# Patient Record
Sex: Female | Born: 1990 | Hispanic: Yes | Marital: Single | Smoking: Never smoker
Health system: Southern US, Community
[De-identification: ages and names within clinical notes are randomized; demographics above are authoritative.]

## PROBLEM LIST (undated history)

## (undated) DIAGNOSIS — E0590 Thyrotoxicosis, unspecified without thyrotoxic crisis or storm: Secondary | ICD-10-CM

## (undated) HISTORY — DX: Thyrotoxicosis, unspecified without thyrotoxic crisis or storm: E05.90

---

## 2015-07-22 ENCOUNTER — Inpatient Hospital Stay: Payer: Charity | Admitting: Internal Medicine

## 2015-07-22 ENCOUNTER — Inpatient Hospital Stay
Admission: EM | Admit: 2015-07-22 | Discharge: 2015-07-25 | DRG: 644 | Disposition: A | Discharge status: Home / Self Care | Payer: Charity | Attending: Internal Medicine | Admitting: Internal Medicine

## 2015-07-22 ENCOUNTER — Emergency Department: Payer: Charity

## 2015-07-22 DIAGNOSIS — E0580 Other thyrotoxicosis without thyrotoxic crisis or storm: Secondary | ICD-10-CM

## 2015-07-22 DIAGNOSIS — Z6823 Body mass index (BMI) 23.0-23.9, adult: Secondary | ICD-10-CM

## 2015-07-22 DIAGNOSIS — E0500 Thyrotoxicosis with diffuse goiter without thyrotoxic crisis or storm: Principal | ICD-10-CM | POA: Diagnosis present

## 2015-07-22 DIAGNOSIS — Z9119 Patient's noncompliance with other medical treatment and regimen: Secondary | ICD-10-CM

## 2015-07-22 DIAGNOSIS — R000 Tachycardia, unspecified: Secondary | ICD-10-CM | POA: Diagnosis present

## 2015-07-22 DIAGNOSIS — E440 Moderate protein-calorie malnutrition: Secondary | ICD-10-CM | POA: Diagnosis present

## 2015-07-22 DIAGNOSIS — R748 Abnormal levels of other serum enzymes: Secondary | ICD-10-CM | POA: Diagnosis present

## 2015-07-22 DIAGNOSIS — E871 Hypo-osmolality and hyponatremia: Secondary | ICD-10-CM | POA: Diagnosis present

## 2015-07-22 DIAGNOSIS — K1120 Sialoadenitis, unspecified: Secondary | ICD-10-CM | POA: Diagnosis present

## 2015-07-22 DIAGNOSIS — E876 Hypokalemia: Secondary | ICD-10-CM | POA: Diagnosis present

## 2015-07-22 DIAGNOSIS — E0590 Thyrotoxicosis, unspecified without thyrotoxic crisis or storm: Secondary | ICD-10-CM | POA: Diagnosis present

## 2015-07-22 DIAGNOSIS — J069 Acute upper respiratory infection, unspecified: Secondary | ICD-10-CM

## 2015-07-22 LAB — COMPREHENSIVE METABOLIC PANEL
ALT: 50 U/L (ref 0–55)
AST (SGOT): 57 U/L — ABNORMAL HIGH (ref 5–34)
Albumin/Globulin Ratio: 0.8 — ABNORMAL LOW (ref 0.9–2.2)
Albumin: 3.3 g/dL — ABNORMAL LOW (ref 3.5–5.0)
Alkaline Phosphatase: 222 U/L — ABNORMAL HIGH (ref 37–106)
BUN: 7 mg/dL (ref 7.0–19.0)
Bilirubin, Total: 0.8 mg/dL (ref 0.2–1.2)
CO2: 19 mEq/L — ABNORMAL LOW (ref 22–29)
Calcium: 8.6 mg/dL (ref 8.5–10.5)
Chloride: 98 mEq/L — ABNORMAL LOW (ref 100–111)
Creatinine: 0.5 mg/dL — ABNORMAL LOW (ref 0.6–1.0)
Globulin: 4.3 g/dL — ABNORMAL HIGH (ref 2.0–3.6)
Glucose: 103 mg/dL — ABNORMAL HIGH (ref 70–100)
Potassium: 2.9 mEq/L — ABNORMAL LOW (ref 3.5–5.1)
Protein, Total: 7.6 g/dL (ref 6.0–8.3)
Sodium: 131 mEq/L — ABNORMAL LOW (ref 136–145)

## 2015-07-22 LAB — MAN DIFF ONLY
Atypical Lymphocytes %: 5 %
Atypical Lymphocytes Absolute: 0.4 10*3/uL — ABNORMAL HIGH
Band Neutrophils Absolute: 0 10*3/uL (ref 0.00–1.00)
Band Neutrophils: 0 %
Lymphocytes Absolute Manual: 3.12 10*3/uL (ref 0.50–4.40)
Lymphocytes Manual: 35 %
Monocytes Absolute: 1.36 10*3/uL — ABNORMAL HIGH (ref 0.00–1.20)
Monocytes Manual: 15 %
Neutrophils Absolute Manual: 3.76 10*3/uL (ref 1.80–8.10)
Nucleated RBC: 0 /100 WBC (ref 0–1)
Plasma Cells Absolute: 0.24 10*3/uL — ABNORMAL HIGH
Plasmacytoid: 3 %
Segmented Neutrophils: 42 %

## 2015-07-22 LAB — CELL MORPHOLOGY
Cell Morphology: ABNORMAL — AB
Platelet Estimate: NORMAL

## 2015-07-22 LAB — URINALYSIS, REFLEX TO MICROSCOPIC EXAM IF INDICATED
Bilirubin, UA: NEGATIVE
Glucose, UA: NEGATIVE
Ketones UA: NEGATIVE
Leukocyte Esterase, UA: NEGATIVE
Nitrite, UA: NEGATIVE
Protein, UR: NEGATIVE
Specific Gravity UA: 1.009 (ref 1.001–1.035)
Urine pH: 7 (ref 5.0–8.0)
Urobilinogen, UA: NORMAL mg/dL

## 2015-07-22 LAB — MAGNESIUM: Magnesium: 2.2 mg/dL (ref 1.6–2.6)

## 2015-07-22 LAB — GFR: EGFR: >60

## 2015-07-22 LAB — CBC AND DIFFERENTIAL
Hematocrit: 37.1 % (ref 37.0–47.0)
Hgb: 12.8 g/dL (ref 12.0–16.0)
MCH: 26.6 pg — ABNORMAL LOW (ref 28.0–32.0)
MCHC: 34.5 g/dL (ref 32.0–36.0)
MCV: 77.1 fL — ABNORMAL LOW (ref 80.0–100.0)
MPV: 9.6 fL (ref 9.4–12.3)
Platelets: 332 10*3/uL (ref 140–400)
RBC: 4.81 10*6/uL (ref 4.20–5.40)
RDW: 14 % (ref 12–15)
WBC: 8.88 10*3/uL (ref 3.50–10.80)

## 2015-07-22 LAB — POCT PREGNANCY TEST, URINE HCG: POCT Pregnancy HCG Test, UR: NEGATIVE

## 2015-07-22 LAB — T4, FREE: T4 Free: >6 ng/dL — ABNORMAL HIGH (ref 0.70–1.48)

## 2015-07-22 LAB — TSH: TSH: <0.01 u[IU]/mL — ABNORMAL LOW (ref 0.35–4.94)

## 2015-07-22 LAB — MONONUCLEOSIS SCREEN: Mono Screen: NEGATIVE

## 2015-07-22 MED ORDER — BENZONATATE 100 MG PO CAPS
200.0000 mg | ORAL_CAPSULE | Freq: Three times a day (TID) | ORAL | Status: DC | PRN
Start: 2015-07-22 — End: 2015-07-25
  Administered 2015-07-23 – 2015-07-25 (×5): 200 mg via ORAL
  Filled 2015-07-22 (×8): qty 2

## 2015-07-22 MED ORDER — HYDROMORPHONE HCL 1 MG/ML IJ SOLN
1.0000 mg | INTRAMUSCULAR | Status: DC | PRN
Start: 2015-07-22 — End: 2015-07-25

## 2015-07-22 MED ORDER — POTASSIUM CHLORIDE CRYS ER 20 MEQ PO TBCR
40.0000 meq | EXTENDED_RELEASE_TABLET | Freq: Once | ORAL | Status: AC
Start: 2015-07-22 — End: 2015-07-22
  Administered 2015-07-22: 40 meq via ORAL
  Filled 2015-07-22: qty 2

## 2015-07-22 MED ORDER — METHIMAZOLE 5 MG PO TABS
10.0000 mg | ORAL_TABLET | Freq: Three times a day (TID) | ORAL | Status: DC
Start: 2015-07-22 — End: 2015-07-23
  Administered 2015-07-22 – 2015-07-23 (×2): 10 mg via ORAL
  Filled 2015-07-22 (×5): qty 2

## 2015-07-22 MED ORDER — POTASSIUM CHLORIDE IN NACL 20-0.9 MEQ/L-% IV SOLN
INTRAVENOUS | Status: DC
Start: 2015-07-22 — End: 2015-07-24
  Administered 2015-07-22 – 2015-07-23 (×3): 100 mL/h via INTRAVENOUS

## 2015-07-22 MED ORDER — SODIUM CHLORIDE 0.9 % IV BOLUS
1000.0000 mL | Freq: Once | INTRAVENOUS | Status: AC
Start: 2015-07-22 — End: 2015-07-22
  Administered 2015-07-22: 1000 mL via INTRAVENOUS

## 2015-07-22 MED ORDER — PROPRANOLOL HCL 10 MG PO TABS
10.0000 mg | ORAL_TABLET | Freq: Three times a day (TID) | ORAL | Status: DC
Start: 2015-07-22 — End: 2015-07-25
  Administered 2015-07-22 – 2015-07-25 (×8): 10 mg via ORAL
  Filled 2015-07-22 (×10): qty 1

## 2015-07-22 MED ORDER — OXYCODONE HCL 5 MG PO TABS
10.0000 mg | ORAL_TABLET | ORAL | Status: DC | PRN
Start: 2015-07-22 — End: 2015-07-25
  Administered 2015-07-22 – 2015-07-24 (×3): 10 mg via ORAL
  Filled 2015-07-22 (×4): qty 2

## 2015-07-22 MED ORDER — GUAIFENESIN-DM 100-10 MG/5ML PO SYRP
10.0000 mL | ORAL_SOLUTION | ORAL | Status: DC | PRN
Start: 2015-07-22 — End: 2015-07-25
  Administered 2015-07-23 – 2015-07-24 (×3): 10 mL via ORAL
  Filled 2015-07-22 (×8): qty 10

## 2015-07-22 MED ORDER — ONDANSETRON HCL 4 MG/2ML IJ SOLN
4.0000 mg | Freq: Four times a day (QID) | INTRAMUSCULAR | Status: DC | PRN
Start: 2015-07-22 — End: 2015-07-25
  Administered 2015-07-23 – 2015-07-24 (×3): 4 mg via INTRAVENOUS
  Filled 2015-07-22 (×4): qty 2

## 2015-07-22 MED ORDER — METHIMAZOLE 5 MG PO TABS
10.0000 mg | ORAL_TABLET | Freq: Three times a day (TID) | ORAL | Status: DC
Start: 2015-07-22 — End: 2015-07-22

## 2015-07-22 MED ORDER — PROPRANOLOL HCL 10 MG PO TABS
10.0000 mg | ORAL_TABLET | Freq: Three times a day (TID) | ORAL | Status: DC
Start: 2015-07-22 — End: 2015-07-22

## 2015-07-22 MED ORDER — ACETAMINOPHEN 325 MG PO TABS
650.0000 mg | ORAL_TABLET | ORAL | Status: DC | PRN
Start: 2015-07-22 — End: 2015-07-25

## 2015-07-22 NOTE — H&P (Addendum)
ADMISSION HISTORY AND PHYSICAL EXAM    Date Time: 07/22/2015 6:20 PM  Patient Name: [name] Carrillo,[name] Carrillo  Attending Physician: Sherwood Gambler, MD  Primary Care Physician: Corliss Parish, MD    CC: facial swelling, cough, fever    Assessment:   Principal Problem:    Thyrotoxicosis  Active Problems:    Parotitis    Hyperthyroidism    Hypokalemia    Hyponatremia    Elevated alkaline phosphatase level      25 yo f w/ h/o hyperthyroidism p/w thyrotoxicosis, parotitis, hypokalemia, hyponatremia, elevated alkphos.   Plan:   Admit to med tele - inpt.  Endocrine consult in am.  Tapazol, propranolol.  Telemetry monitoring.  Replete, monitor K, Na.  Check antithyroglobulin AB, antiperoxidate AB, thyroid stimulating immunoglobulin.  RUQ [place], follow alkphos.  SCD's for DVT prophylaxis.    Disposition: (Please see PAF column for Expected D/C Date)   Today's date: 07/22/2015  Admit Date: 07/22/2015  1:58 PM  Anticipated medical stability for discharge:Red - not tomorrow - estimated month/date: 2/27  Service status: Inpatient: risk of morbidity and mortality  Clinical Milestones: TBD  Anticipated discharge needs:f/u by PMD, endocrine    History of Presenting Illness:   [name] Carrillo is a 25 y.o. female w/ h/o hyperthyroidism who p/w bilateral facial swelling x 2 days, F/C/SOB x 1 wk.  Pt was diagnosed w/ hyperthyroidism 2 yrs ago.  But Pt did not take any medication then.  Pt has not followed up due to lack of insurance.  Pt reports baseline fatigue and night sweats .  Pt's labs showed TSH < 0.01, T4 free>6.00, Na 131, K 2.9.  Pt denies N/V/D/CP/abd pain/urinary sx's/focal weakness or any other sx's.    Past Medical History:     Past Medical History   Diagnosis Date   . Hyperthyroidism        Available old records reviewed, including:  EPIC   Past Surgical History:   History reviewed. No pertinent past surgical history.    Family History:     Family History   Problem Relation Age of Onset   . Cancer Father    . Cancer  Sister          Social History:     History   Smoking status   . Never Smoker    Smokeless tobacco   . Not on file     History   Alcohol Use No     History   Drug Use Not on file       Allergies:   No Known Allergies    Medications:     Current/Home Medications    No medications on file         Method by which medications were confirmed on admission:     Review of Systems:   All other systems were reviewed and are negative.    Physical Exam:     Patient Vitals for the past 24 hrs:   BP Temp Temp src Pulse Resp SpO2 Height Weight   07/23/15 0035 111/53 mmHg 97.3 F (36.3 C) Oral (!) 109 19 91 % - -   07/22/15 2021 119/61 mmHg 98.4 F (36.9 C) Oral 92 18 95 % - -   07/22/15 1915 128/60 mmHg - - (!) 122 14 98 % - -   07/22/15 1914 128/60 mmHg - - - - - - -   07/22/15 1913 - - - (!) 122 - 98 % - -   07/22/15  1730 117/58 mmHg - - (!) 128 - 95 % - -   07/22/15 1636 138/63 mmHg - - (!) 122 - 98 % - -   07/22/15 1600 138/63 mmHg - - (!) 122 - 98 % - -   07/22/15 1500 142/66 mmHg - - (!) 132 14 98 % - -   07/22/15 1407 - - - (!) 128 - 98 % - -   07/22/15 1406 125/84 mmHg - - - - - - -   07/22/15 1403 - 99.9 F (37.7 C) Oral - - - - -   07/22/15 1349 147/83 mmHg - - (!) 135 18 98 % 1.549 m (5\' 1" ) 55.792 kg (123 lb)   07/22/15 1343 - 98.2 F (36.8 C) Temporal Art (!) 155 - 99 % - -     Body mass index is 23.25 kg/(m^2).  No intake or output data in the 24 hours ending 07/22/15 1820    General: awake, alert, oriented x 3; no acute distress.  HEENT: bilateral parotitis, perrla, eomi, sclera anicteric  oropharynx clear without lesions, mucous membranes moist  Neck: supple, no lymphadenopathy, no thyromegaly, no JVD, no carotid bruits  Cardiovascular: tachycardic, regular rhythm, no murmurs, rubs or gallops  Lungs: clear to auscultation bilaterally, without wheezing, rhonchi, or rales  Abdomen: soft, non-tender, non-distended; no palpable masses, no hepatosplenomegaly, normoactive bowel sounds, no rebound or  guarding  Extremities: no clubbing, cyanosis, or edema  Neuro: cranial nerves grossly intact, no focal findings   Skin: no rashes or lesions noted      I have personally reviewed the following labs and diagnostic studies.  Labs:     Results     Procedure Component Value Units Date/Time    T4 (Free) [[id number]]  (Abnormal) Collected:  07/22/15 1428    Specimen Information:  Blood Updated:  07/22/15 1742     T4 Free >6.00 (H) ng/dL     Mononucleosis Screen [[id number]] Collected:  07/22/15 1428    Specimen Information:  Blood Updated:  07/22/15 1734     Mono Screen Negative     CBC with differential [[id number]]  (Abnormal) Collected:  07/22/15 1428    Specimen Information:  Blood from Blood Updated:  07/22/15 1632     WBC 8.88 x10 3/uL      Hgb 12.8 g/dL      Hematocrit 83.1 %      Platelets 332 x10 3/uL      RBC 4.81 x10 6/uL      MCV 77.1 (L) fL      MCH 26.6 (L) pg      MCHC 34.5 g/dL      RDW 14 %      MPV 9.6 fL     Manual Differential [[id number]]  (Abnormal) Collected:  07/22/15 1428     Segmented Neutrophils 42 % Updated:  07/22/15 1632     Band Neutrophils 0 %      Lymphocytes Manual 35 %      Monocytes Manual 15 %      Atypical Lymph % 5 %      Plasmacytoid 3 %      Nucleated RBC 0 /100 WBC      Abs Seg Manual 3.76 x10 3/uL      Bands Absolute 0.00 x10 3/uL      Absolute Lymph Manual 3.12 x10 3/uL      Monocytes Absolute 1.36 (H) x10 3/uL      Atypical Lymph Absolute 0.40 (H) x10  3/uL      Absolute Plasma Cell 0.24 (H) x10 3/uL     Cell MorpHology [[id number]]  (Abnormal) Collected:  07/22/15 1428     Cell Morphology: Abnormal (A) Updated:  07/22/15 1632     Microcytic =1+ (A)      Polychromasia =1+ (A)      Ovalocytes =1+ (A)      Platelet Estimate Normal     UA, Reflex to Microscopic (pts  3 + yrs) [[id number]]  (Abnormal) Collected:  07/22/15 1438    Specimen Information:  Urine Updated:  07/22/15 1622     Urine Type Clean Catch      Color, UA Yellow      Clarity, UA Hazy      Specific Gravity UA 1.009       Urine pH 7.0      Leukocyte Esterase, UA Negative      Nitrite, UA Negative      Protein, UR Negative      Glucose, UA Negative      Ketones UA Negative      Urobilinogen, UA Normal mg/dL      Bilirubin, UA Negative      Blood, UA Small (A)      RBC, UA 0 - 5 /hpf      WBC, UA 0 - 5 /hpf      Squamous Epithelial Cells, Urine 0 - 5 /hpf     TSH [[id number]]  (Abnormal) Collected:  07/22/15 1428    Specimen Information:  Blood Updated:  07/22/15 1525     Thyroid Stimulating Hormone <0.01 (L) uIU/mL     Comprehensive metabolic panel [[id number]]  (Abnormal) Collected:  07/22/15 1428    Specimen Information:  Blood Updated:  07/22/15 1509     Glucose 103 (H) mg/dL      BUN 7.0 mg/dL      Creatinine 0.5 (L) mg/dL      Sodium 629 (L) mEq/L      Potassium 2.9 (L) mEq/L      Chloride 98 (L) mEq/L      CO2 19 (L) mEq/L      Calcium 8.6 mg/dL      Protein, Total 7.6 g/dL      Albumin 3.3 (L) g/dL      AST (SGOT) 57 (H) U/L      ALT 50 U/L      Alkaline Phosphatase 222 (H) U/L      Bilirubin, Total 0.8 mg/dL      Globulin 4.3 (H) g/dL      Albumin/Globulin Ratio 0.8 (L)     GFR [[id number]] Collected:  07/22/15 1428     EGFR >60.0 Updated:  07/22/15 1509    Rapid Influenza A/B Antigen [[id number]] Collected:  07/22/15 1426    Specimen Information:  Nasopharyngeal from Nasal Aspirate Updated:  07/22/15 1453    Narrative:      ORDER#: [id number]                                    ORDERED BY: Melodie Bouillon  SOURCE: Nasal Aspirate                               COLLECTED:  07/22/15 14:26  ANTIBIOTICS AT COLL.:  RECEIVED :  07/22/15 14:33  Influenza Rapid Antigen A&B                FINAL       07/22/15 14:53  07/22/15   Negative for Influenza A and B             Reference Range: Negative      Urine HCG, POC/ Qualitative [[id number]] Collected:  07/22/15 1437    Specimen Information:  Urine Updated:  07/22/15 1438     POCT QC Pass      POCT Pregnancy HCG Test, UR Negative      Comment:        Result:         Negative Value is Normal in Healthy Males or Healthy non-pregnant Females        EKG: ST @123     Imaging personally reviewed, including:     Chest 2 Views    07/22/2015  There is no evidence of acute disease. Stephannie Peters, MD 07/22/2015 3:11 PM         Safety Checklist  DVT prophylaxis:  CHEST guideline (See page e199S) Mechanical   Foley:  [place] Rn Foley protocol Not present   IVs:  Peripheral IV   PT/OT: Not needed   Daily CBC & or Chem ordered:  SHM/ABIM guidelines (see #5) Yes, due to clinical and lab instability   Reference for approximate charges of common labs: CBC auto diff - $76  BMP - $99  Mg - $79    Signed by: Gardiner Sleeper, MD   YN:WGNFA, Theodosia Blender, MD  '

## 2015-07-22 NOTE — ED Notes (Signed)
See quick triage note. Pt has multiple complaints - Pt reports fever, chills, cough, HA, n/v, dizziness, & shortness of breath. Pt also reports syncopal episode yesterday. Ambulatory to triage w/out difficulty.

## 2015-07-22 NOTE — ED Provider Notes (Signed)
[facility] EMERGENCY DEPARTMENT RESIDENT H&P       CLINICAL INFORMATION        HPI:      Chief Complaint: Facial Swelling; Cough; and Fever  .    [name] Carrillo is a 25 y.o. female G0 presenting with 2 day history of worsening SOB, cough, fevers, facial swelling, 2 week history of night sweats, and chronic progressive fatigue. The patient states waking up 2 days ago and noting painful swelling in her cheeks. She has no difficulty The patient states that she has a previous diagnosis of hyperthryoidism by labs and [place] 2 years previously. The patient states that she has had fatigue since this diagnosis. She has not continued follow up or treatment for it due to insurance issues.     She denies sick contacts, recent travel, but emig[place] years ago. She states her immunizations are UTD with exception of flu.     History obtained from: patient        ROS:      Review of Systems   Constitutional: Positive for fever, chills, diaphoresis and fatigue.   HENT: Positive for congestion and ear discharge.    Respiratory: Positive for cough and shortness of breath.    All other systems reviewed and are negative.        Physical Exam:      Pulse (!) 155  BP 147/83 mmHg  Resp 18  SpO2 99 %  Temp 98.2 F (36.8 C)    Physical Exam   Constitutional: She is oriented to person, place, and time. She appears well-developed and well-nourished. She appears distressed.   HENT:   Nose: Nose normal.   Mouth/Throat: No oropharyngeal exudate.   Bilateral parotitis, Bilateral swollen tonsils, oropharynx clear without lesions   Eyes: Conjunctivae are normal. Right eye exhibits no discharge. Left eye exhibits no discharge.   Neck: Normal range of motion. Thyromegaly present.   Cardiovascular: Regular rhythm, normal heart sounds and intact distal pulses.  Exam reveals no gallop and no friction rub.    No murmur heard.  Tachycardic   Pulmonary/Chest: Effort normal and breath sounds normal. No  respiratory distress. She has no wheezes. She has no rales. She exhibits no tenderness.   Abdominal: Soft. Bowel sounds are normal. She exhibits no distension and no mass. There is no tenderness. There is no rebound and no guarding. No hernia.   Musculoskeletal: Normal range of motion. She exhibits tenderness (pretibial). She exhibits no edema.   Lymphadenopathy:     She has cervical adenopathy.   Neurological: She is alert and oriented to person, place, and time.   Skin: Skin is warm. No erythema.   Nursing note and vitals reviewed.    Results     Procedure Component Value Units Date/Time    TSH [[id number]]  (Abnormal) Collected:  07/22/15 1428    Specimen Information:  Blood Updated:  07/22/15 1525     Thyroid Stimulating Hormone <0.01 (L) uIU/mL     UA, Reflex to Microscopic (pts  3 + yrs) [[id number]]  (Abnormal) Collected:  07/22/15 1438    Specimen Information:  Urine Updated:  07/22/15 1524     Urine Type Clean Catch      Color, UA Yellow      Clarity, UA Hazy      Specific Gravity UA 1.009      Urine pH 7.0      Leukocyte Esterase, UA Negative      Nitrite,  UA Negative      Protein, UR Negative      Glucose, UA Negative      Ketones UA Negative      Urobilinogen, UA Normal mg/dL      Bilirubin, UA Negative      Blood, UA Small (A)      RBC, UA 0 - 5 /hpf      WBC, UA 0 - 5 /hpf      Squamous Epithelial Cells, Urine 0 - 5 /hpf     Comprehensive metabolic panel [[id number]]  (Abnormal) Collected:  07/22/15 1428    Specimen Information:  Blood Updated:  07/22/15 1509     Glucose 103 (H) mg/dL      BUN 7.0 mg/dL      Creatinine 0.5 (L) mg/dL      Sodium 409 (L) mEq/L      Potassium 2.9 (L) mEq/L      Chloride 98 (L) mEq/L      CO2 19 (L) mEq/L      Calcium 8.6 mg/dL      Protein, Total 7.6 g/dL      Albumin 3.3 (L) g/dL      AST (SGOT) 57 (H) U/L      ALT 50 U/L      Alkaline Phosphatase 222 (H) U/L      Bilirubin, Total 0.8 mg/dL      Globulin 4.3 (H) g/dL      Albumin/Globulin Ratio 0.8 (L)     GFR [[id number]]  Collected:  07/22/15 1428     EGFR >60.0 Updated:  07/22/15 1509    Rapid Influenza A/B Antigen [[id number]] Collected:  07/22/15 1426    Specimen Information:  Nasopharyngeal from Nasal Aspirate Updated:  07/22/15 1453    Narrative:      ORDER#: [id number]                                    ORDERED BY: Melodie Bouillon  SOURCE: Nasal Aspirate                               COLLECTED:  07/22/15 14:26  ANTIBIOTICS AT COLL.:                                RECEIVED :  07/22/15 14:33  Influenza Rapid Antigen A&B                FINAL       07/22/15 14:53  07/22/15   Negative for Influenza A and B             Reference Range: Negative      CBC with differential [[id number]]  (Abnormal) Collected:  07/22/15 1428    Specimen Information:  Blood from Blood Updated:  07/22/15 1444     WBC 8.88 x10 3/uL      Hgb 12.8 g/dL      Hematocrit 01.0 %      Platelets 332 x10 3/uL      RBC 4.81 x10 6/uL      MCV 77.1 (L) fL      MCH 26.6 (L) pg      MCHC 34.5 g/dL      RDW 14 %      MPV 9.6 fL     Urine  HCG, POC/ Qualitative [[id number]] Collected:  07/22/15 1437    Specimen Information:  Urine Updated:  07/22/15 1438     POCT QC Pass      POCT Pregnancy HCG Test, UR Negative      Comment:        Result:        Negative Value is Normal in Healthy Males or Healthy non-pregnant Females    T4 (Free) [[id number]] Collected:  07/22/15 1428    Specimen Information:  Blood Updated:  07/22/15 1434    Mononucleosis Screen [[id number]] Collected:  07/22/15 1428    Specimen Information:  Blood Updated:  07/22/15 1434        No results found.            PAST HISTORY        Primary Care Provider: Corliss Parish, MD        PMH/PSH:    .     Past Medical History   Diagnosis Date   . Disorder of thyroid        She has no past surgical history on file.      Social/Family History:      She reports that she has never smoked. She does not have any smokeless tobacco history on file. She reports that she does not drink alcohol. Her drug history is not on  file.    History reviewed. No pertinent family history.      Listed Medications on Arrival:    .     Home Medications     Last Medication Reconciliation Action:  Complete Gwyneth Sprout, RN 07/22/2015  2:03 PM          No Medications         Allergies: She has No Known Allergies.            VISIT INFORMATION        Reassessments/Clinical Course:    3:36 PM Reassessment with Dr. Geraldo Pitter. TSH <0.01. Fluids running. Patient remains tachycardic in 120s. BP remains elevated at 150s systolic. Patient requesting cough suppressant. Awaiting FT4. Remaining labs reviewed.         Conversations with Other Providers:              Medications Given in the ED:    .     ED Medication Orders     None            Procedures:      Procedures      Assessment/Plan:    25 y.o. female presenting with 2 day history of facial swelling, SOB, cough, fevers, 2 week history of night sweats in context of previous diagnosis of uncontrolled hyperthyroidism.    DDX include viral syndrome (influenza, CMV/EBV, mumps) vs. Uncontrolled hyperthyroidism vs. Other infectious etiology such as TB    Plan  1. IVF  2. Labs to include thyroid function testing  3. CXR  4. Tylenol PRN fever/pain  Dispo: pending work up    Patient discussed with Dr. Geraldo Pitter, ED staff.              Garnett Farm, MD  Resident  07/22/15 1506    Garnett Farm, MD  Resident  07/22/15 1519    Garnett Farm, MD  Resident  07/22/15 [phone]

## 2015-07-22 NOTE — ED Provider Notes (Signed)
[facility] EMERGENCY DEPARTMENT H&P                                             ATTENDING SUPERVISORY NOTE           CLINICAL SUMMARY          Diagnosis:    .     Final diagnoses:   Thyrotoxicosis without thyroid storm, unspecified thyrotoxicosis type   Viral URI   Parotitis         MDM Notes:    Pt with viral sx, significant parotid swelling, EBV and influenza negative.  Prior hx of hyperthyroid, today showing thyrotoxicosis.  BP stable.  Pt admitted to medicine for stabilization of thyroid disease, supportive care.       Disposition:      ED Disposition     Admit Admitting Physician: Gardiner Sleeper [[id number]]  Diagnosis: Thyrotoxicosis [242251]  Estimated Length of Stay: > or = to 2 midnights  Tentative Discharge Plan?: Home or Self Care [1]  Patient Class: Inpatient [101]            Discharge Prescriptions     None                 SUPERVISORY STATEMENT:    I have reviewed and agree with the history except as noted below. The pertinent physical exam has been documented.  I have reviewed and agree with the final ED diagnosis.          CLINICAL INFORMATION        HPI:      Chief Complaint: Facial Swelling; Cough; and Fever  .    [name] Carrillo is a 25 y.o. female with a pmhx of hyperthyroidism who presents with moderate constant worsening b/l facial swelling x 2 days. A/w fevers, chills and SOB x 1 week. Patient has night sweats and fatigue at baseline. Patient states she has not followed up for her hyperthyroidism since her dx 2 years ago due to insurance concerns.     History obtained from: Patient      ROS:      Positive and negative ROS elements as per HPI.  All other systems reviewed and negative.      Physical Exam:      Pulse (!) 155  BP 147/83 mmHg  Resp 18  SpO2 99 %  Temp 98.2 F (36.8 C)    General: Well appearing, A&Ox3. Comfortable, NAD. Non toxic appearing.  Head: Atraumatic, normocephalic. Parotid swelling b/l with ttp. No obvious fluctuance   Eyes: No  conjunctival injection. No discharge.  EOMI.  ENT:  Mucous membranes moist. OP clear.   Neck: Supple. Normal range of motion.  Respiratory/Chest:  No respiratory distress. Lungs clear  CV: Tachy, no murmurs appreciated  Neurological: Grossly symmetric strength and sensation in bilateral arms and legs.   CN II-XII grossly intact.  Speech normal, no facial asymmetry.  Skin: Warm and dry. No rash.  Psychiatric: Normal affect.  Normal insight.               PAST HISTORY        Primary Care Provider: Corliss Parish, MD        PMH/PSH:    .     Past Medical History   Diagnosis Date   . Hyperthyroidism  She has no past surgical history on file.      Social/Family History:      She reports that she has never smoked. She does not have any smokeless tobacco history on file. She reports that she does not drink alcohol. Her drug history is not on file.    History reviewed. No pertinent family history.      Listed Medications on Arrival:    .     Previous Medications    No medications on file      Allergies: She has No Known Allergies.            VISIT INFORMATION        Clinical Course in the ED:    Long delay awaiting return of Free T4      Consults in ED/phone:  6:00 PM - Spoke to Dr. Janece Canterbury. Admit inpatient to Dr. Durene Cal.      Counseling: I have spoken with the patient and discussed today's findings, in addition to providing specific details for the plan of care. Questions are answered and there is agreement with the plan.         Medications Given in the ED:    .     ED Medication Orders     Start Ordered     Status Ordering Provider    07/22/15 1513 07/22/15 1512  sodium chloride 0.9 % bolus 1,000 mL   Once     Route: Intravenous  Ordered Dose: 1,000 mL     Last MAR action:  Stopped GORDON, NATHANIEL T            Procedures:      Procedures      Interpretations:        Differential Diagnosis (not completely inclusive): Parotitis, viral disease, thyrotoxicosis     O2 sat-           saturation: 99 %; Oxygen use: room  air; Interpretation: Normal    EKG -             interpreted by me: sinus tachycardia at 125.  No ST abnormalities                     RESULTS        Lab Results:      Results     Procedure Component Value Units Date/Time    Magnesium [[id number]] Collected:  07/22/15 1428    Specimen Information:  Blood Updated:  07/22/15 1931     Magnesium 2.2 mg/dL     Narrative:      May be added to the specimen in the lab    T4 (Free) [[id number]]  (Abnormal) Collected:  07/22/15 1428    Specimen Information:  Blood Updated:  07/22/15 1742     T4 Free >6.00 (H) ng/dL     Mononucleosis Screen [[id number]] Collected:  07/22/15 1428    Specimen Information:  Blood Updated:  07/22/15 1734     Mono Screen Negative     CBC with differential [[id number]]  (Abnormal) Collected:  07/22/15 1428    Specimen Information:  Blood from Blood Updated:  07/22/15 1632     WBC 8.88 x10 3/uL      Hgb 12.8 g/dL      Hematocrit 62.9 %      Platelets 332 x10 3/uL      RBC 4.81 x10 6/uL      MCV 77.1 (L) fL      MCH 26.6 (  L) pg      MCHC 34.5 g/dL      RDW 14 %      MPV 9.6 fL     Manual Differential [[id number]]  (Abnormal) Collected:  07/22/15 1428     Segmented Neutrophils 42 % Updated:  07/22/15 1632     Band Neutrophils 0 %      Lymphocytes Manual 35 %      Monocytes Manual 15 %      Atypical Lymph % 5 %      Plasmacytoid 3 %      Nucleated RBC 0 /100 WBC      Abs Seg Manual 3.76 x10 3/uL      Bands Absolute 0.00 x10 3/uL      Absolute Lymph Manual 3.12 x10 3/uL      Monocytes Absolute 1.36 (H) x10 3/uL      Atypical Lymph Absolute 0.40 (H) x10 3/uL      Absolute Plasma Cell 0.24 (H) x10 3/uL     Cell MorpHology [[id number]]  (Abnormal) Collected:  07/22/15 1428     Cell Morphology: Abnormal (A) Updated:  07/22/15 1632     Microcytic =1+ (A)      Polychromasia =1+ (A)      Ovalocytes =1+ (A)      Platelet Estimate Normal     UA, Reflex to Microscopic (pts  3 + yrs) [[id number]]  (Abnormal) Collected:  07/22/15 1438    Specimen Information:  Urine  Updated:  07/22/15 1622     Urine Type Clean Catch      Color, UA Yellow      Clarity, UA Hazy      Specific Gravity UA 1.009      Urine pH 7.0      Leukocyte Esterase, UA Negative      Nitrite, UA Negative      Protein, UR Negative      Glucose, UA Negative      Ketones UA Negative      Urobilinogen, UA Normal mg/dL      Bilirubin, UA Negative      Blood, UA Small (A)      RBC, UA 0 - 5 /hpf      WBC, UA 0 - 5 /hpf      Squamous Epithelial Cells, Urine 0 - 5 /hpf     TSH [[id number]]  (Abnormal) Collected:  07/22/15 1428    Specimen Information:  Blood Updated:  07/22/15 1525     Thyroid Stimulating Hormone <0.01 (L) uIU/mL     Comprehensive metabolic panel [[id number]]  (Abnormal) Collected:  07/22/15 1428    Specimen Information:  Blood Updated:  07/22/15 1509     Glucose 103 (H) mg/dL      BUN 7.0 mg/dL      Creatinine 0.5 (L) mg/dL      Sodium 253 (L) mEq/L      Potassium 2.9 (L) mEq/L      Chloride 98 (L) mEq/L      CO2 19 (L) mEq/L      Calcium 8.6 mg/dL      Protein, Total 7.6 g/dL      Albumin 3.3 (L) g/dL      AST (SGOT) 57 (H) U/L      ALT 50 U/L      Alkaline Phosphatase 222 (H) U/L      Bilirubin, Total 0.8 mg/dL      Globulin 4.3 (H) g/dL      Albumin/Globulin  Ratio 0.8 (L)     GFR [[id number]] Collected:  07/22/15 1428     EGFR >60.0 Updated:  07/22/15 1509    Rapid Influenza A/B Antigen [[id number]] Collected:  07/22/15 1426    Specimen Information:  Nasopharyngeal from Nasal Aspirate Updated:  07/22/15 1453    Narrative:      ORDER#: [id number]                                    ORDERED BY: Melodie Bouillon  SOURCE: Nasal Aspirate                               COLLECTED:  07/22/15 14:26  ANTIBIOTICS AT COLL.:                                RECEIVED :  07/22/15 14:33  Influenza Rapid Antigen A&B                FINAL       07/22/15 14:53  07/22/15   Negative for Influenza A and B             Reference Range: Negative      Urine HCG, POC/ Qualitative [[id number]] Collected:  07/22/15 1437    Specimen  Information:  Urine Updated:  07/22/15 1438     POCT QC Pass      POCT Pregnancy HCG Test, UR Negative      Comment:        Result:        Negative Value is Normal in Healthy Males or Healthy non-pregnant Females              Radiology Results:      Chest 2 Views   Final Result      There is no evidence of acute disease.      Stephannie Peters, MD    07/22/2015 3:11 PM                   Scribe Attestation:      I was acting as a Neurosurgeon for Sherwood Gambler, MD on VIGIL CLAROS,[name] Carrillo  Treatment Team: Scribe: Bennett Scrape     I am the first provider for this patient and I personally performed the services documented. Treatment Team: Scribe: Bennett Scrape is scribing for me on VIGIL CLAROS,[name] Carrillo. This note and the patient instructions accurately reflect work and decisions made by me.  Sherwood Gambler, MD                                         Sherwood Gambler, MD  07/22/15 [phone]

## 2015-07-23 ENCOUNTER — Encounter: Payer: Self-pay | Admitting: Internal Medicine

## 2015-07-23 ENCOUNTER — Inpatient Hospital Stay: Payer: Charity

## 2015-07-23 DIAGNOSIS — E0501 Thyrotoxicosis with diffuse goiter with thyrotoxic crisis or storm: Secondary | ICD-10-CM

## 2015-07-23 LAB — HEPATIC FUNCTION PANEL
ALT: 49 U/L (ref 0–55)
AST (SGOT): 62 U/L — ABNORMAL HIGH (ref 5–34)
Albumin/Globulin Ratio: 0.8 — ABNORMAL LOW (ref 0.9–2.2)
Albumin: 2.8 g/dL — ABNORMAL LOW (ref 3.5–5.0)
Alkaline Phosphatase: 176 U/L — ABNORMAL HIGH (ref 37–106)
Bilirubin Direct: 0.2 mg/dL (ref 0.0–0.5)
Bilirubin Indirect: 0.3 mg/dL (ref 0.0–1.1)
Bilirubin, Total: 0.5 mg/dL (ref 0.2–1.2)
Globulin: 3.6 g/dL (ref 2.0–3.6)
Protein, Total: 6.4 g/dL (ref 6.0–8.3)

## 2015-07-23 LAB — T3: T3: 3.99 ng/mL — ABNORMAL HIGH (ref 0.48–1.59)

## 2015-07-23 LAB — BASIC METABOLIC PANEL
BUN: 7 mg/dL (ref 7.0–19.0)
CO2: 19 mEq/L — ABNORMAL LOW (ref 22–29)
Calcium: 8.9 mg/dL (ref 8.5–10.5)
Chloride: 109 mEq/L (ref 100–111)
Creatinine: 0.5 mg/dL — ABNORMAL LOW (ref 0.6–1.0)
Glucose: 118 mg/dL — ABNORMAL HIGH (ref 70–100)
Potassium: 3.7 mEq/L (ref 3.5–5.1)
Sodium: 138 mEq/L (ref 136–145)

## 2015-07-23 LAB — T4: T4: 17.4 ug/dL — ABNORMAL HIGH (ref 4.9–11.7)

## 2015-07-23 LAB — T4, FREE: T4 Free: 4.68 ng/dL — ABNORMAL HIGH (ref 0.70–1.48)

## 2015-07-23 LAB — GFR: EGFR: >60

## 2015-07-23 LAB — T3, FREE: T3, Free: 21.43 pg/mL — ABNORMAL HIGH (ref 1.71–3.71)

## 2015-07-23 MED ORDER — METHIMAZOLE 5 MG PO TABS
20.0000 mg | ORAL_TABLET | Freq: Three times a day (TID) | ORAL | Status: DC
Start: 2015-07-23 — End: 2015-07-25
  Administered 2015-07-23 – 2015-07-25 (×6): 20 mg via ORAL
  Filled 2015-07-23 (×9): qty 4

## 2015-07-23 MED ORDER — PREDNISONE 20 MG PO TABS
20.0000 mg | ORAL_TABLET | Freq: Every morning | ORAL | Status: DC
Start: 2015-07-23 — End: 2015-07-25
  Administered 2015-07-23 – 2015-07-25 (×3): 20 mg via ORAL
  Filled 2015-07-23 (×3): qty 1

## 2015-07-23 NOTE — UM Notes (Signed)
Systemic or Infectious Condition GRG - Clinical Indications for Admission to Inpatient Care      07/22/15 1802  Admit to Inpatient - (ADULT INPATIENT ADMIT PANEL) Once  Comments: I certify that inpatient servi...    07/22/15 1802        Date 2/25    Pt presented to the ED      Chief Complaint: Facial Swelling; Cough; and Fever  .   25 y.o. female with a pmhx of hyperthyroidism who presents with moderate constant worsening b/l facial swelling x 2 days. A/w fevers, chills and SOB x 1 week. Patient has night sweats and fatigue at baseline. Patient states she has not followed up for her hyperthyroidism since her dx 2 years ago due to insurance concerns.     VS;  Pulse (!) 155  BP 147/83 mmHg  Resp 18  SpO2 99 %  Temp 98.2 F (36.8 C)    ED Medication Orders     Start Ordered   Status Ordering Provider    07/22/15 1513 07/22/15 1512  sodium chloride 0.9 % bolus 1,000 mL Once    Route: Intravenous Ordered Dose: 1,000 mL     Last MAR action: Stopped Rachell Cipro T              Lab Results:      Labs in the last 24 hours     Results     Procedure Component Value Units Date/Time    Magnesium [[id number]] Collected: 07/22/15 1428    Specimen Information: Blood Updated: 07/22/15 1931     Magnesium 2.2 mg/dL     Narrative:     May be added to the specimen in the lab    T4 (Free) [[id number]] (Abnormal) Collected: 07/22/15 1428    Specimen Information: Blood Updated: 07/22/15 1742     T4 Free >6.00 (H) ng/dL     Mononucleosis Screen [[id number]] Collected: 07/22/15 1428    Specimen Information: Blood Updated: 07/22/15 1734     Mono Screen Negative     CBC with differential [[id number]] (Abnormal) Collected: 07/22/15 1428    Specimen Information: Blood from Blood Updated: 07/22/15 1632     WBC 8.88 x10 3/uL      Hgb 12.8 g/dL      Hematocrit 62.9 %      Platelets 332 x10 3/uL      RBC 4.81 x10 6/uL       MCV 77.1 (L) fL      MCH 26.6 (L) pg      MCHC 34.5 g/dL      RDW 14 %      MPV 9.6 fL     Manual Differential [[id number]] (Abnormal) Collected: 07/22/15 1428     Segmented Neutrophils 42 % Updated: 07/22/15 1632     Band Neutrophils 0 %      Lymphocytes Manual 35 %      Monocytes Manual 15 %      Atypical Lymph % 5 %      Plasmacytoid 3 %      Nucleated RBC 0 /100 WBC      Abs Seg Manual 3.76 x10 3/uL      Bands Absolute 0.00 x10 3/uL      Absolute Lymph Manual 3.12 x10 3/uL      Monocytes Absolute 1.36 (H) x10 3/uL      Atypical Lymph Absolute 0.40 (H) x10 3/uL      Absolute Plasma Cell 0.24 (H) x10 3/uL  Cell MorpHology [[id number]] (Abnormal) Collected: 07/22/15 1428     Cell Morphology: Abnormal (A) Updated: 07/22/15 1632     Microcytic =1+ (A)      Polychromasia =1+ (A)      Ovalocytes =1+ (A)      Platelet Estimate Normal     UA, Reflex to Microscopic (pts 3 + yrs) [[id number]] (Abnormal) Collected: 07/22/15 1438    Specimen Information: Urine Updated: 07/22/15 1622     Urine Type Clean Catch      Color, UA Yellow      Clarity, UA Hazy      Specific Gravity UA 1.009      Urine pH 7.0      Leukocyte Esterase, UA Negative      Nitrite, UA Negative      Protein, UR Negative      Glucose, UA Negative      Ketones UA Negative      Urobilinogen, UA Normal mg/dL      Bilirubin, UA Negative      Blood, UA Small (A)      RBC, UA 0 - 5 /hpf      WBC, UA 0 - 5 /hpf      Squamous Epithelial Cells, Urine 0 - 5 /hpf     TSH [[id number]] (Abnormal) Collected: 07/22/15 1428    Specimen Information: Blood Updated: 07/22/15 1525     Thyroid Stimulating Hormone <0.01 (L) uIU/mL     Comprehensive metabolic panel [[id number]] (Abnormal) Collected: 07/22/15 1428     Specimen Information: Blood Updated: 07/22/15 1509     Glucose 103 (H) mg/dL      BUN 7.0 mg/dL      Creatinine 0.5 (L) mg/dL      Sodium 010 (L) mEq/L      Potassium 2.9 (L) mEq/L      Chloride 98 (L) mEq/L      CO2 19 (L) mEq/L      Calcium 8.6 mg/dL      Protein, Total 7.6 g/dL      Albumin 3.3 (L) g/dL      AST (SGOT) 57 (H) U/L      ALT 50 U/L      Alkaline Phosphatase 222 (H) U/L      Bilirubin, Total 0.8 mg/dL      Globulin 4.3 (H) g/dL      Albumin/Globulin Ratio 0.8 (L)     GFR [[id number]] Collected: 07/22/15 1428     EGFR >60.0 Updated: 07/22/15 1509    Rapid Influenza A/B Antigen [[id number]] Collected: 07/22/15 1426    Specimen Information: Nasopharyngeal from Nasal Aspirate Updated: 07/22/15 1453    Narrative:     ORDER#: [id number] ORDERED BY: Melodie Bouillon  SOURCE: Nasal Aspirate COLLECTED: 07/22/15 14:26  ANTIBIOTICS AT COLL.: RECEIVED : 07/22/15 14:33  Influenza Rapid Antigen A&B FINAL 07/22/15 14:53  07/22/15 Negative for Influenza A and B   Reference Range: Negative      Urine HCG, POC/ Qualitative [[id number]] Collected: 07/22/15 1437    Specimen Information: Urine Updated: 07/22/15 1438     POCT QC Pass      POCT Pregnancy HCG Test, UR Negative      Comment:        Result:      Negative Value is Normal in Healthy Males or Healthy non-pregnant Females                    2/25 History and physical examination  CC: facial swelling,  cough, fever      History of present illness:  25 y.o. female w/ h/o hyperthyroidism who p/w bilateral facial swelling x 2 days, F/C/SOB x 1 wk. Pt was diagnosed w/ hyperthyroidism 2 yrs ago. But Pt did not take any medication then. Pt has not followed up due to lack of insurance. Pt  reports baseline fatigue and night sweats . Pt's labs showed TSH < 0.01, T4 free>6.00, Na 131, K 2.9. Pt denies N/V/D/CP/abd pain/urinary sx's/focal weakness or any other sx's.    25 yo f w/ h/o hyperthyroidism p/w thyrotoxicosis, parotitis, hypokalemia, hyponatremia, elevated alkphos.   Plan:   Admit to med tele - inpt.  Endocrine consult in am.  Tapazol, propranolol.  Telemetry monitoring.  Replete, monitor K, Na.  Check antithyroglobulin AB, antiperoxidate AB, thyroid stimulating immunoglobulin.  RUQ [place], follow alkphos.  SCD's for DVT prophylaxis.              Continued stay review for 2/26:    VS:  BP 111/53, temp 97.3, pulse 109, RR 19, O2 sat 91%    Meds:  Scheduled Meds:  Current Facility-Administered Medications   Medication Dose Route Frequency   . methIMAzole  20 mg Oral Q8H SCH   . predniSONE  20 mg Oral QAM W/BREAKFAST   . propranolol  10 mg Oral Q8H SCH     Continuous Infusions:  . 0.9 % NaCl with KCl 20 mEq 100 mL/hr (07/23/15 0757)           EKG: ST @123     US Abdomen Limited Ruq  07/23/2015 1. The echotexture of the liver is within normal limits.. There is no intrahepatic biliary ductal dilatation or mass. 2. No gallstones or evidence of acute cholecystitis. 3. There is no hydronephrosis within the right kidney. The remainder is as above.    Medicine MD progress note 2/26:  Plan:   Subjective fever, patient has been afebrile, infectious workup in progress. It seems less likely that the current presentation is due to infectious etiology.  Thyrotoxicosis, endocrinology evaluation is requested continue with her current treatment. Further diagnostic evaluation ordered.  Hypokalemia, potassium level repleted.  Gastrointestinal and deep vein thrombosis prophylaxis as needed case discussed with endocrinology team in detail, time spent is 40 minutes greater than 50 percent time spent counseling and coronation of care            Zenia Resides, RN BSN  UR Case Manager  Case Management Department  [facility]  [phone]

## 2015-07-23 NOTE — Plan of Care (Signed)
Problem: Fluid and Electrolyte Imbalance/ Endocrine  Goal: Fluid and electrolyte balance are achieved/maintained  Outcome: Progressing  Patient admitted with potassium of 2.9.  Oral potassium given in emergency department. Patient currently receiving intravenous replenishment of 20K with NS. Patient running ST on telemetry with no ectopy.  No evidence of seizures.  Will continue to monitor labs as needed and as ordered.

## 2015-07-23 NOTE — Plan of Care (Signed)
Problem: Pain  Goal: Patient's pain/discomfort is manageable  Outcome: Progressing  Tessalon 200mg  po given for non-productive cough.     Problem: Fluid and Electrolyte Imbalance/ Endocrine  Goal: Fluid and electrolyte balance are achieved/maintained  Intervention: Monitor/assess lab values and report abnormal values.  Repeat K 3.7  Intervention: Provide adequate hydration.  Continues .9NS + 20 K @ 100cc/hr.       Comments:   Abdominal US completed. Awaiting [place] of neck. Remains on telemetry.

## 2015-07-23 NOTE — Progress Notes (Signed)
MEDICINE PROGRESS NOTE    Date Time: 07/23/2015 1:28 PM  Patient Name: [name] Carrillo,[name] Carrillo  Attending Physician: Lucilla Edin, MD    Assessment:   Principal Problem:    Thyrotoxicosis  Active Problems:    Parotitis    Hyperthyroidism    Hypokalemia    Hyponatremia    Elevated alkaline phosphatase level  Resolved Problems:    * No resolved hospital problems. *          Plan:   Subjective fever, patient has been afebrile, infectious workup in progress.  It seems less likely that the current presentation is due to infectious etiology.  Thyrotoxicosis, endocrinology evaluation is requested continue with her current treatment.  Further diagnostic evaluation ordered.  Hypokalemia, potassium level repleted.  Gastrointestinal and deep vein thrombosis prophylaxis as needed case discussed with endocrinology team in detail, time spent is 40 minutes greater than 50 percent time spent counseling and coronation of care.  Safety Checklist:     DVT prophylaxis:  CHEST guideline (See page [id number]) Chemical   Foley:  [place]ton Rn Foley protocol Not present   IVs:  Peripheral IV   PT/OT: Not needed   Daily CBC & or Chem ordered:  SHM/ABIM guidelines (see #5) Yes, due to clinical and lab instability   Reference for approximate charges of common labs: CBC auto diff - $76  BMP - $99  Mg - $79    Lines:     Patient Lines/Drains/Airways Status    Active PICC Line / CVC Line / PIV Line / Drain / Airway / Intraosseous Line / Epidural Line / ART Line / Line / Wound / Pressure Ulcer / NG/OG Tube     Name:   Placement date:   Placement time:   Site:   Days:    Peripheral IV 07/22/15 Right Antecubital  07/22/15   1428   Antecubital   less than 1                 Disposition: (Please see PAF column for Expected D/C Date)   Today's date: 07/23/2015  Admit Date: 07/22/2015  1:58 PM  LOS: 1  Clinical Milestones: Thyrotoxicosis  Anticipated discharge needs: to be determined      Subjective     CC: Thyrotoxicosis    Interval History/24 hour events: No acute  complaint.        Review of Systems:     No acute complaint     Physical Exam:     VITAL SIGNS PHYSICAL EXAM   Temp:  [97.3 F (36.3 C)-99.9 F (37.7 C)] 98 F (36.7 C)  Heart Rate:  [92-155] 115  Resp Rate:  [14-22] 22  BP: (111-147)/(53-84) 113/58 mmHg  Blood Glucose:    Telemetry: sinus    No intake or output data in the 24 hours ending 07/23/15 1328 Physical Exam  General: awake, alert X 3  Cardiovascular: tachy, no murmurs, rubs or gallops  Lungs: clear to auscultation bilaterally, without wheezing, rhonchi, or rales  Abdomen: soft, non-tender, non-distended; no palpable masses,  normoactive bowel sounds  Extremities: no edema         Meds:     Medications were reviewed:    Labs:     Labs (last 72 hours):      Recent Labs  Lab 07/22/15  1428   WBC 8.88   HGB 12.8   HEMATOCRIT 37.1   PLATELETS 332            Recent Labs  Lab  07/22/15  1428   SODIUM 131*   POTASSIUM 2.9*   CHLORIDE 98*   CO2 19*   BUN 7.0   CREATININE 0.5*   CALCIUM 8.6   ALBUMIN 3.3*   PROTEIN, TOTAL 7.6   BILIRUBIN, TOTAL 0.8   ALKALINE PHOSPHATASE 222*   ALT 50   AST (SGOT) 57*   GLUCOSE 103*                   Microbiology, reviewed and are significant for:  Microbiology Results     Procedure Component Value Units Date/Time    Rapid Influenza A/B Antigen [[id number]] Collected:  07/22/15 1426    Specimen Information:  Nasopharyngeal from Nasal Aspirate Updated:  07/22/15 1453    Narrative:      ORDER#: [id number]                                    ORDERED BY: Melodie Bouillon  SOURCE: Nasal Aspirate                               COLLECTED:  07/22/15 14:26  ANTIBIOTICS AT COLL.:                                RECEIVED :  07/22/15 14:33  Influenza Rapid Antigen A&B                FINAL       07/22/15 14:53  07/22/15   Negative for Influenza A and B             Reference Range: Negative              Imaging, reviewed and are significant for:  Chest 2 Views    07/22/2015  There is no evidence of acute disease. Stephannie Peters, MD 07/22/2015 3:11  PM     US Abdomen Limited Ruq    07/23/2015  1. The echotexture of the liver is within normal limits.. There is no intrahepatic biliary ductal dilatation or mass. 2. No gallstones or evidence of acute cholecystitis. 3. There is no hydronephrosis within the right kidney. The remainder is as above. Stephannie Peters, MD 07/23/2015 8:47 AM         Signed by: Lucilla Edin, MD

## 2015-07-23 NOTE — Consults (Addendum)
ENDOCRINE NEW CONSULT    Date Time: 07/23/2015 3:48 PM  Patient Name: [name] Carrillo,[name] Carrillo  Requesting Physician: Lucilla Edin, MD  Consulting Physician: Clyde Canterbury, MD  Admission Date: 07/22/2015    Primary Care Physician: Corliss Parish, MD    Endocrinology Consultation for: Hyperglycemia    Assessment/Recommendations:     [name] Carrillo is a 25 y.o. woman with h/o hyperthyroidims, medical non-compliance who presented with acute worsening hyperthyroidism following a URI.  Her Burch-Wartofsky-Score is 65, thus increase risk of developing thyroid strom. Abnormal liver function test and hypokalemia are both likely secondary to her hyperthyroidism.      Recommend methimazole 20 mg TID.  Will have to monitor hepatic panel.    TSI, TPO pending   Will check thyroid ultrasound   Recommend prednisone 20 mg daily   Continue propranolol per primary team.    Reviewed RUQ u/s WNL.   Will continue to follow    Lucilla Edin, MD, thank you for this consultation.  We will follow the patient with you during this hospitalization.  Please contact me with any questions or issues.    Total time of encounter was 45 min > 50% time was spent reviewing her chart, updating her orders, and at the bedside counseling & coordination of care regarding the above needs.     Clyde Canterbury, MD  [facility]  [phone] or Tiger Text or pager ID: [phone]   Hours Mon. - Friday 8- 5:00pm  On nights, holidays and weekends please page the endocrine consult pager [phone]) for emergencies.     History of Presenting Illness:   [name] Carrillo is a 25 y.o. female who was admitted on 07/22/2015 for thyrotoxicosis.  [name] Carrillo is [place] and was diagnosed with hyperthyroidism 2 years ago.  She wast treated with 2 medications, though she cannot recall the names.  She did not notice any improvement with these pills and did not follow up.  Over the past few months she c/o of increase heat  intolerance, 15lb weight loss over 1 month, heart palpitations, had tremor, sweating, sob, insomnia, trouble swallowing and hoarse voice.  She also reports increase anxiety, 3-4 soft stools daily, hair loss, and irregular menstrual cycles.      Past Medical History:     Past Medical History   Diagnosis Date   . Hyperthyroidism         Past Surgical History:   History reviewed. No pertinent past surgical history.    Social History:   Patient  reports that she has never smoked. She does not have any smokeless tobacco history on file. She reports that she does not drink alcohol.    Family History:   Patient reports family history includes Cancer in her father and sister. There is no history of Thyroid disease.     Allergies:   No Known Allergies    Medications:     No prescriptions prior to admission       Current Facility-Administered Medications   Medication Dose Route Frequency   . methIMAzole  20 mg Oral Q8H SCH   . predniSONE  20 mg Oral QAM W/BREAKFAST   . propranolol  10 mg Oral [facility]        Available old records reviewed, including:  Epic.    Review of Systems:   All other systems were reviewed and are negative except per HPI.     Physical Exam:   Temp:  [  97.3 F (36.3 C)-98.4 F (36.9 C)] 98 F (36.7 C)  Heart Rate:  [92-128] 104  Resp Rate:  [14-22] 22  BP: (111-138)/(53-63) 112/58 mmHg   Wt Readings from Last 3 Encounters:   07/22/15 55.792 kg (123 lb)     Body mass index is 23.25 kg/(m^2).  No intake or output data in the 24 hours ending 07/23/15 1548    General: awake, alert, oriented x 3; no acute distress.  HEENT: perrla, no exophthalmos, no lid lag, eomi, sclera anicteric  oropharynx clear without lesions, mucous membranes moist  Neck: supple, no lymphadenopathy, diffuse thyromegaly, no palpable thyroid nodules, thyroid bruit, no JVD  Cardiovascular: regular rate and rhythm, no murmurs, rubs or gallops  Lungs: clear to auscultation bilaterally, without wheezing, rhonchi, or rales  Abdomen: soft,  non-tender, non-distended; no palpable masses, no hepatosplenomegaly, normoactive bowel sounds, no rebound or guarding  Extremities: no clubbing, cyanosis, or edema  Neuro: A+O x 3, cranial nerves grossly intact, strength 5/5 in upper and lower extremities, sensation intact, DTR 2+ and brisk in bilateral bicep tendons and 3+in bilateral patellar tendons. Bilateral fine hand tremor.   Skin: no rashes or lesions noted      Labs:   No results found for: GLUCOSEWB    No results found for: HGBA1C    Recent Labs      07/22/15   1428   WBC  8.88   HGB  12.8   HEMATOCRIT  37.1   PLATELETS  332       Recent Labs      07/23/15   1425  07/22/15   1428   SODIUM  138  131*   POTASSIUM  3.7  2.9*   CHLORIDE  109  98*   CO2  19*  19*   BUN  7.0  7.0   CREATININE  0.5*  0.5*   GLUCOSE  118*  103*   CALCIUM  8.9  8.6   MAGNESIUM   --   2.2       Recent Labs      07/23/15   1425  07/22/15   1428   AST (SGOT)  62*  57*   ALT  49  50   ALKALINE PHOSPHATASE  176*  222*   PROTEIN, TOTAL  6.4  7.6   ALBUMIN  2.8*  3.3*       No results for input(s): PTT, PT, INR in the last 72 hours.    THYROID STIMULATING HORMONE   Date Value Ref Range Status   07/22/2015 <0.01* 0.35 - 4.94 uIU/mL Final     T4 FREE   Date Value Ref Range Status   07/23/2015 4.68* 0.70 - 1.48 ng/dL Final   16/02/9603 >5.40* 0.70 - 1.48 ng/dL Final     T4   Date Value Ref Range Status   07/23/2015 17.4* 4.9 - 11.7 ug/dL Final     T3, FREE   Date Value Ref Range Status   07/23/2015 21.43* 1.71 - 3.71 pg/mL Final     T3   Date Value Ref Range Status   07/23/2015 3.99* 0.48 - 1.59 ng/mL Final       Imaging:     Imaging personally reviewed, including: US Abdomen Limited Ruq    07/23/2015  1. The echotexture of the liver is within normal limits.. There is no intrahepatic biliary ductal dilatation or mass. 2. No gallstones or evidence of acute cholecystitis. 3. There is no hydronephrosis within the right kidney. The  remainder is as above. Stephannie Peters, MD 07/23/2015 8:47  AM     Signed by: Clyde Canterbury, MD    cc: Lucilla Edin, MD  Corliss Parish, MD

## 2015-07-23 NOTE — Interim Summary (Signed)
ENDOCRINE INTERIM NOTE    Date Time: 07/23/2015 12:37 PM  Patient Name: [name] Carrillo,[name] Carrillo  Attending Physician: Lucilla Edin, MD  Consulting Physician: Clyde Canterbury, MD  Admit Date:  07/22/2015    Assessment/Plan:     Patient Active Problem List    Diagnosis Date Noted   . Thyrotoxicosis 07/22/2015   . Parotitis 07/22/2015   . Hyperthyroidism 07/22/2015   . Hypokalemia 07/22/2015   . Hyponatremia 07/22/2015   . Elevated alkaline phosphatase level 07/22/2015      Recommend Methimazole 20 mg TID   Will check freeT4, TSIab, and TPOab   Added prednisone 20 mg q AM   Formal consultation to follow.     Clyde Canterbury, MD  [facility]    On nights, holidays and weekends please page the endocrine consult pager [phone]) for emergencies/questions.     Vitals:   Temp:  [97.3 F (36.3 C)-99.9 F (37.7 C)] 98 F (36.7 C)  Heart Rate:  [92-155] 115  Resp Rate:  [14-22] 22  BP: (111-147)/(53-84) 113/58 mmHg  Wt Readings from Last 1 Encounters:   07/22/15 55.792 kg (123 lb)      Body mass index is 23.25 kg/(m^2).   No intake or output data in the 24 hours ending 07/23/15 1237    Meds:     Scheduled Meds:  Current Facility-Administered Medications   Medication Dose Route Frequency   . methIMAzole  20 mg Oral Q8H SCH   . predniSONE  20 mg Oral QAM W/BREAKFAST   . propranolol  10 mg Oral Q8H SCH     Continuous Infusions:  . 0.9 % NaCl with KCl 20 mEq 100 mL/hr (07/23/15 0757)     PRN Meds:.acetaminophen, benzonatate, guaiFENesin-dextromethorphan, HYDROmorphone, ondansetron, oxyCODONE    Labs:     No results found for: HGBA1C    No results found for: GLUCOSEWB        Recent Labs      07/22/15   1428   WBC  8.88   HGB  12.8   HEMATOCRIT  37.1   PLATELETS  332   MCV  77.1*       Recent Labs      07/22/15   1428   SODIUM  131*   POTASSIUM  2.9*   CHLORIDE  98*   CO2  19*   BUN  7.0   CREATININE  0.5*   GLUCOSE  103*   CALCIUM  8.6   MAGNESIUM  2.2       Recent Labs      07/22/15   1428   AST (SGOT)  57*   ALT  50    ALKALINE PHOSPHATASE  222*   PROTEIN, TOTAL  7.6   ALBUMIN  3.3*       No results for input(s): PTT, PT, INR in the last 72 hours.      Signed by: Clyde Canterbury, MD

## 2015-07-23 NOTE — Plan of Care (Signed)
Patient admitted from emergency department. Ambulated from stretcher to bed, steady and stable. Alert and oriented. C/o facial pain 8/10. Non-productive cough. NS with 20K running at 170ml/h to RAC. Educated on SCDs. Placed on tele. Oriented to room. . Call bell within reach. Bed in lowest position.

## 2015-07-24 ENCOUNTER — Inpatient Hospital Stay: Payer: Charity

## 2015-07-24 ENCOUNTER — Encounter (INDEPENDENT_AMBULATORY_CARE_PROVIDER_SITE_OTHER): Payer: Self-pay

## 2015-07-24 LAB — T3: T3: 2.83 ng/mL — ABNORMAL HIGH (ref 0.48–1.59)

## 2015-07-24 LAB — CBC AND DIFFERENTIAL
Basophils Absolute Automated: 0.02 10*3/uL (ref 0.00–0.20)
Basophils Automated: 0 %
Eosinophils Absolute Automated: 0.03 10*3/uL (ref 0.00–0.70)
Eosinophils Automated: 0 %
Hematocrit: 31.8 % — ABNORMAL LOW (ref 37.0–47.0)
Hgb: 10.4 g/dL — ABNORMAL LOW (ref 12.0–16.0)
Immature Granulocytes Absolute: 0.02 10*3/uL
Immature Granulocytes: 0 %
Lymphocytes Absolute Automated: 5.71 10*3/uL — ABNORMAL HIGH (ref 0.50–4.40)
Lymphocytes Automated: 62 %
MCH: 26.1 pg — ABNORMAL LOW (ref 28.0–32.0)
MCHC: 32.7 g/dL (ref 32.0–36.0)
MCV: 79.9 fL — ABNORMAL LOW (ref 80.0–100.0)
MPV: 9.9 fL (ref 9.4–12.3)
Monocytes Absolute Automated: 0.64 10*3/uL (ref 0.00–1.20)
Monocytes: 7 %
Neutrophils Absolute: 2.83 10*3/uL (ref 1.80–8.10)
Neutrophils: 31 %
Nucleated RBC: 0 /100 WBC (ref 0–1)
Platelets: 328 10*3/uL (ref 140–400)
RBC: 3.98 10*6/uL — ABNORMAL LOW (ref 4.20–5.40)
RDW: 15 % (ref 12–15)
WBC: 9.25 10*3/uL (ref 3.50–10.80)

## 2015-07-24 LAB — COMPREHENSIVE METABOLIC PANEL
ALT: 36 U/L (ref 0–55)
AST (SGOT): 29 U/L (ref 5–34)
Albumin/Globulin Ratio: 0.7 — ABNORMAL LOW (ref 0.9–2.2)
Albumin: 2.6 g/dL — ABNORMAL LOW (ref 3.5–5.0)
Alkaline Phosphatase: 153 U/L — ABNORMAL HIGH (ref 37–106)
BUN: 8 mg/dL (ref 7.0–19.0)
Bilirubin, Total: 0.4 mg/dL (ref 0.2–1.2)
CO2: 19 mEq/L — ABNORMAL LOW (ref 22–29)
Calcium: 8.6 mg/dL (ref 8.5–10.5)
Chloride: 112 mEq/L — ABNORMAL HIGH (ref 100–111)
Creatinine: 0.5 mg/dL — ABNORMAL LOW (ref 0.6–1.0)
Globulin: 3.5 g/dL (ref 2.0–3.6)
Glucose: 107 mg/dL — ABNORMAL HIGH (ref 70–100)
Potassium: 3.5 mEq/L (ref 3.5–5.1)
Protein, Total: 6.1 g/dL (ref 6.0–8.3)
Sodium: 140 mEq/L (ref 136–145)

## 2015-07-24 LAB — ECG 12-LEAD
Atrial Rate: 123 {beats}/min
Atrial Rate: 93 {beats}/min
P Axis: 47 degrees
P Axis: 55 degrees
P-R Interval: 132 ms
P-R Interval: 246 ms
Q-T Interval: 348 ms
Q-T Interval: 366 ms
QRS Duration: 90 ms
QRS Duration: 88 ms
QTC Calculation (Bezet): 498 ms
QTC Calculation (Bezet): 455 ms
R Axis: 24 degrees
R Axis: 29 degrees
T Axis: 43 degrees
T Axis: 33 degrees
Ventricular Rate: 123 {beats}/min
Ventricular Rate: 93 {beats}/min

## 2015-07-24 LAB — T4, FREE: T4 Free: 4.16 ng/dL — ABNORMAL HIGH (ref 0.70–1.48)

## 2015-07-24 LAB — GFR: EGFR: >60

## 2015-07-24 NOTE — Plan of Care (Signed)
Problem: Safety  Goal: Patient will be free from injury during hospitalization  Intervention: Ensure appropriate safety devices are available at the bedside  Bed in lowest position, wheels locked, call light in reach

## 2015-07-24 NOTE — Progress Notes (Signed)
Schedule follow up appointment with [facility] on  3/10 @ 10:00am in the [facility]      [place]  [phone]

## 2015-07-24 NOTE — Progress Notes (Signed)
MEDICINE PROGRESS NOTE    Date Time: 07/24/2015 5:30 AM  Patient Name: [name],[name]  Attending Physician: Lucilla Edin, MD    Assessment:   Principal Problem:    Thyrotoxicosis  Active Problems:    Parotitis    Hyperthyroidism    Hypokalemia    Hyponatremia    Elevated alkaline phosphatase level  Resolved Problems:    * No resolved hospital problems. *    25 yo F w/ a PMH significant for hyperthyroidism diagnosed in 2015 and not compliant with treatment admitted on 07/22/2015 with thyrotoxicosis preceded by a prodrome of URI. Also found to have parotitis, hypokalemia, hyponatremia, elevated alk phos.   Plan:   #Thyrotoxicosis: Improving, with no evidence of thyroid storm. In the setting of known hyperthyroidism (Graves Disease). Elevated T4 (>6) and low TSH (<0.01) on admission c/w thyrotoxicosis; also with T3 of 3.99, Free T3 of 21.43, T4 17.4. Sinus tachycardia has improved, free T4 downtrending (4.68->4.16) with treatment. Etiology may be viral thyroiditis given URI symptoms and tender thyroid on exam. Struma ovarii is unlikely given negative urine HCG in ED, and the patient is not on any medications that would precipitate hyperthyroidism.  -F/u TSI/thyroid peroxidase antibodies  -Thyroid u/s ordered, will f/u  -Tapazol, propranolol  -Prednisone 20mg  daily (2/26-)  -Endocrine following, appreciate recs    #Parotitis: possibly viral  -Prednisone as above    #Hyponatremia: resolved, possibly related to thyrotoxicosis. Na 131 on admission, 138 yesterday.  -IVF    #Hypokalemia: resolved, likely related to #1. K 2.9 on admission, 3.7 yesterday.  -IVF    #Elevated alk phos: likely related to #1. Improved (222 on admission, 176 yesterday)  -RUQ u/s wnl    F: PO  E: Replete PRN  N: Regular diet    DVT PPx: SCD's      Case discussed with: Lucilla Edin, MD     Safety Checklist:     DVT prophylaxis:  CHEST guideline (See page [phone]) Mechanical   Foley:  Lovilia Rn Foley protocol Not present   IVs:  Peripheral IV   PT/OT:  Not needed   Daily CBC & or Chem ordered:  SHM/ABIM guidelines (see #5) Yes, due to clinical and lab instability   Reference for approximate charges of common labs: CBC auto diff - $76  BMP - $99  Mg - $79    Lines:     Patient Lines/Drains/Airways Status    Active PICC Line / CVC Line / PIV Line / Drain / Airway / Intraosseous Line / Epidural Line / ART Line / Line / Wound / Pressure Ulcer / NG/OG Tube     Name:   Placement date:   Placement time:   Site:   Days:    Peripheral IV 07/22/15 Right Antecubital  07/22/15   1428   Antecubital   1                   Subjective     CC: Thyrotoxicosis    Interval History/24 hour events:     History obtained via chart review and direct interview of the patient.     [name] Carrillo is a 25 y.o. female w/ PMH significant for hyperthyroidism who presented to the hospital on 07/22/2015 with 2 days of worsening SOB, cough, subjective, facial swelling, preceded by 2 weeks of night sweats, and chronic progressive fatigue. The patient states waking up 2 days ago and noting painful swelling in her cheeks. She has never had these symptoms before.  Tylenol and aleve provided some relief.     She denies pain with chewing. She endorses dry mouth, but not dry eyes.     She practices good oral hygiene. She denies sick contacts, recent travel, but emigrated from [place] 12 years ago. She states her immunizations are UTD with exception of flu. She has never been on amiodarone.    She was initially diagnosed with hyperthyroidism in October 2015 by endocrinologist Dr. Barbra Sarks [facility]), an ultrasound then showed enlarged hypervascular heterogeneous thyroid gland suggestive of Graves' disease and mildly enlarged neck lymph nodes as described above.  Recommendation was made to start propranolol 60mg  PO daily qhs, methimazole 1mg  BID. She has not continued follow up or treatment for it due to insurance issues. She stopped taking her meds because they "did not make her feel  good."    In the ED on the day of admission, she was noted to have TSH <0.01, Free T4 >6; also with mild hyponatremia to 131, hypokalemia to 2.9.    Patient was started on methimazole, propranolol, potassium was repleted with Kdur and NS infusion with KCl was started.     HPI/Subjective: Today, the patient feels better, but still endorses cough, sore throat, rhinorrhea.     Review of Systems:     Over the past few months she c/o of increase heat intolerance, 15lb weight loss over 1 month, heart palpitations, had tremor, sweating, sob, insomnia, trouble swallowing and hoarse voice. She also reports increase anxiety, 3-4 soft stools daily, hair loss, and irregular menstrual cycles.     Physical Exam:     VITAL SIGNS PHYSICAL EXAM   Temp:  [96.1 F (35.6 C)-98 F (36.7 C)] 96.2 F (35.7 C)  Heart Rate:  [95-115] 99  Resp Rate:  [18-22] 20  BP: (108-157)/(58-64) 129/63 mmHg  Blood Glucose:    Telemetry: n/a    No intake or output data in the 24 hours ending 07/24/15 0530 Physical Exam  General: calm, NAD, AOx4  HEENT: no proptosis or lid lag; tender thyroid, subtly enlarged; tonsils are erythematous with no exudate; parotid glands mildly enlarged- with no exudate expressed from parotid duct  Cardiovascular: regular rate and rhythm, no murmurs, rubs or gallops  Lungs: clear to auscultation bilaterally, without wheezing, rhonchi, or rales  Abdomen: soft, non-tender, non-distended; no palpable masses,  normoactive bowel sounds  Extremities: no edema         Meds:     Medications were reviewed:    Current facility-administered medications:   .  0.9 % NaCl with KCl 20 mEq infusion, , Intravenous, Continuous, Hunter, Daleen Squibb, MD, Last Rate: 100 mL/hr at 07/23/15 1836, 100 mL/hr at 07/23/15 1836  .  acetaminophen (TYLENOL) tablet 650 mg, 650 mg, Oral, Q4H PRN, Gardiner Sleeper, MD  .  benzonatate (TESSALON) capsule 200 mg, 200 mg, Oral, TID PRN, Gardiner Sleeper, MD, 200 mg at 07/24/15 0327  .   guaiFENesin-dextromethorphan (ROBITUSSIN DM) 100-10 MG/5ML syrup 10 mL, 10 mL, Oral, Q4H PRN, Gardiner Sleeper, MD, 10 mL at 07/23/15 2247  .  HYDROmorphone (DILAUDID) injection 1 mg, 1 mg, Intravenous, Q4H PRN, Gardiner Sleeper, MD  .  methIMAzole (TAPAZOLE) tablet 20 mg, 20 mg, Oral, Q8H SCH, Clyde Canterbury, MD, 20 mg at 07/24/15 0602  .  ondansetron (ZOFRAN) injection 4 mg, 4 mg, Intravenous, Q6H PRN, Gardiner Sleeper, MD, 4 mg at 07/24/15 0128  .  oxyCODONE (ROXICODONE) immediate release tablet 10 mg,  10 mg, Oral, Q4H PRN, Gardiner Sleeper, MD, 10 mg at 07/24/15 0127  .  predniSONE (DELTASONE) tablet 20 mg, 20 mg, Oral, QAM Hillard Danker, MD, 20 mg at 07/23/15 1345  .  propranolol (INDERAL) tablet 10 mg, 10 mg, Oral, Q8H SCH, Gardiner Sleeper, MD, 10 mg at 07/24/15 0602      Labs:     Labs (last 72 hours):      Recent Labs  Lab 07/22/15  1428   WBC 8.88   HGB 12.8   HEMATOCRIT 37.1   PLATELETS 332            Recent Labs  Lab 07/23/15  1425 07/22/15  1428   SODIUM 138 131*   POTASSIUM 3.7 2.9*   CHLORIDE 109 98*   CO2 19* 19*   BUN 7.0 7.0   CREATININE 0.5* 0.5*   CALCIUM 8.9 8.6   ALBUMIN 2.8* 3.3*   PROTEIN, TOTAL 6.4 7.6   BILIRUBIN, TOTAL 0.5 0.8   ALKALINE PHOSPHATASE 176* 222*   ALT 49 50   AST (SGOT) 62* 57*   GLUCOSE 118* 103*                   Microbiology, reviewed and are significant for:  Microbiology Results     Procedure Component Value Units Date/Time    Rapid Influenza A/B Antigen [[id number]] Collected:  07/22/15 1426    Specimen Information:  Nasopharyngeal from Nasal Aspirate Updated:  07/22/15 1453    Narrative:      ORDER#: [id number]                                    ORDERED BY: Melodie Bouillon  SOURCE: Nasal Aspirate                               COLLECTED:  07/22/15 14:26  ANTIBIOTICS AT COLL.:                                RECEIVED :  07/22/15 14:33  Influenza Rapid Antigen A&B                FINAL       07/22/15 14:53  07/22/15   Negative for Influenza A and B              Reference Range: Negative             Imaging, reviewed and are significant for:  Chest 2 Views    07/22/2015  There is no evidence of acute disease. Stephannie Peters, MD 07/22/2015 3:11 PM     US Abdomen Limited Ruq    07/23/2015  1. The echotexture of the liver is within normal limits.. There is no intrahepatic biliary ductal dilatation or mass. 2. No gallstones or evidence of acute cholecystitis. 3. There is no hydronephrosis within the right kidney. The remainder is as above. Stephannie Peters, MD 07/23/2015 8:47 AM           Signed by: Barbra Sarks, MD

## 2015-07-24 NOTE — Malnutrition Assessment (Signed)
NUTRITION:  Reason for Assessment: RN Screen     Recommend:  Regular diet as tolerated, encourage po intake  If po intake <75% at meals, rec re-offer supplement - Ensure Enlive with meals     Clinical Update:  [place] is a(n) 25 y.o. female with thyrotoxicosis, parotitis, hyperthyroidism, hypokalemia, hyponatremia      Labs: Glu 107, Alk Phos 153, Alb 2.6   Meds: prednisone, idneral, IVF @ 100 ml/hr     Anthropometrics:  Height: 154.9 cm (5\' 1" )  Weight: 55.792 kg (123 lb)  Body mass index is 23.25 kg/(m^2).    Diet / Nutrition Support Order: Regular diet   Per patient, lost about 5# x 1 week, poor po intake x 1 week, no supplements requested at this time     Nutrition Goals: 1550-1700 kcals, 65-75 gm protein     Nutrition Diagnosis:   Moderate protein calorie malnutrition related to thyrotoxicosis, parotitis, electrolyte abnormalities a/e/b lab values, poor po intake w/ wt loss of 4% x 1 week     Monitor/Eval:  Monitor nutr support goals, labs, GI, med tx plan    Georgina Pillion, RD

## 2015-07-24 NOTE — Progress Notes (Signed)
Attending Attestation:     I have seen and personally examined the patient.  I agree with the findings and exam as documented by Dr. Ulla Potash.    Plan:  Thyrotoxicosis, at the current time patient is hemodynamically stable, endocrinology evaluation and recommendation is appreciated thyroid sonogram pending.  Parotitis, improving  Hyponatremia likely related to thyrotoxicosis sodium is improving.  Hypokalemia, potassium supplemented, currently at normal level.        Disposition:     Today's date: 07/24/2015  Admit Date: 07/22/2015  1:58 PM  Anticipated medical stability for discharge:Yellow - maybe tomorrow  Service status: Inpatient: risk of morbidity and mortality, risk of progressive disease and risk of readmission  Clinical Milestones: Thyrotoxicosis     Anticipated discharge needs: TBD determined.    Lucilla Edin, MD

## 2015-07-24 NOTE — Plan of Care (Signed)
Problem: Fluid and Electrolyte Imbalance/ Endocrine  Goal: Fluid and electrolyte balance are achieved/maintained  Outcome: Progressing  Pt's electrolytes are returning to baseline. Pt's potassium and sodium are stable  Goal: Adequate hydration  Outcome: Progressing  Pt has no nausea and is eating and drinking

## 2015-07-24 NOTE — Progress Notes (Addendum)
ENDOCRINE PROGRESS NOTE    Date Time: 07/24/2015 1:01 PM  Patient Name: [name] Carrillo Carrillo,[name] Carrillo  Attending Physician: Lucilla Edin, MD  Consulting Physician: Clyde Canterbury, MD  Admit Date:  07/22/2015    CC: Hyperthyroidism    Assessment/Plan:   Ms. [name] Carrillo Carrillo is a 25 y.o. 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 Her Burch-Wartofsky-Score was 65, thus increase risk of developing thyroid strom. Abnormal liver function test (stable) and hypokalemia (resolved) were both likely secondary to her hyperthyroidism. No thyroid nodules appreciated on neck ultrasound.      Continue methimazole 20 mg TID. Will have to monitor hepatic panel.    TSI, TPO pending   Continue prednisone 20 mg daily   Continue propranolol per primary team.    Reviewed RUQ u/s WNL.   Will continue to follow   Reviewed thyroid ultrasound, see below.     Total time of encounter was 26 min > 50% time was spent reviewing her chart, updating her orders, and at the bedside counseling & coordination of care regarding the above needs.    Clyde Canterbury, MD  [facility]  [phone] or Tiger Text or pager ID: [phone]   Hours Mon. - Friday 8- 5:00pm  On nights, holidays and weekends please page the endocrine consult pager [phone]) for emergencies.     Subjective:    Ms. [name] Carrillo Carrillo c/o of mild chest discomfort this morning and continued loose stools.  She denies sob, abd pain, but reports some nausea.        Review of Systems:   Review of Systems - Negative except per HPI      Physical Exam:   Temp:  [96.1 F (35.6 C)-97.2 F (36.2 C)] 97 F (36.1 C)  Heart Rate:  [88-104] 88  Resp Rate:  [18-20] 18  BP: (100-157)/(54-64) 100/54 mmHg  Wt Readings from Last 1 Encounters:   07/22/15 55.792 kg (123 lb)      Body mass index is 23.25  kg/(m^2).   No intake or output data in the 24 hours ending 07/24/15 1301    General: awake, alert, oriented x 3; no acute distress.  Cardiovascular: regular rate and rhythm, no murmurs, rubs or gallops  Lungs: clear to auscultation bilaterally, without wheezing, rhonchi, or rales  Abdomen: soft, non-tender, non-distended; no palpable masses, normoactive bowel sounds, no rebound or guarding  Extremities: no clubbing, cyanosis, or edema.  DTRs 2+ and brisk in bilateral bicep tendons.     Meds:     Scheduled Meds:  Current Facility-Administered Medications   Medication Dose Route Frequency   . methIMAzole  20 mg Oral Q8H SCH   . predniSONE  20 mg Oral QAM W/BREAKFAST   . propranolol  10 mg Oral Q8H SCH     Continuous Infusions:  . 0.9 % NaCl with KCl 20 mEq 100 mL/hr (07/23/15 1836)     PRN Meds:.acetaminophen, benzonatate, guaiFENesin-dextromethorphan, HYDROmorphone, ondansetron, oxyCODONE    Labs:     THYROID STIMULATING HORMONE   Date Value Ref Range Status   07/22/2015 <0.01* 0.35 - 4.94 uIU/mL Final     T4 FREE   Date Value Ref Range Status   07/24/2015 4.16* 0.70 - 1.48 ng/dL Final   66/44/0347 4.25* 0.70 - 1.48 ng/dL Final   95/63/8756 >4.33* 0.70 - 1.48 ng/dL Final     T4  Date Value Ref Range Status   07/23/2015 17.4* 4.9 - 11.7 ug/dL Final     T3, FREE   Date Value Ref Range Status   07/23/2015 21.43* 1.71 - 3.71 pg/mL Final     T3   Date Value Ref Range Status   07/24/2015 2.83* 0.48 - 1.59 ng/mL Final   07/23/2015 3.99* 0.48 - 1.59 ng/mL Final        Recent Labs      07/24/15   0907  07/22/15   1428   WBC  9.25  8.88   HGB  10.4*  12.8   HEMATOCRIT  31.8*  37.1   PLATELETS  328  332   MCV  79.9*  77.1*       Recent Labs      07/24/15   0907  07/23/15   1425  07/22/15   1428   SODIUM  140  138  131*   POTASSIUM  3.5  3.7  2.9*   CHLORIDE  112*  109  98*   CO2  19*  19*  19*   BUN  8.0  7.0  7.0   CREATININE  0.5*  0.5*  0.5*   GLUCOSE  107*  118*  103*   CALCIUM  8.6  8.9  8.6   MAGNESIUM   --    --   2.2         Recent Labs      07/24/15   0907  07/23/15   1425   AST (SGOT)  29  62*   ALT  36  49   ALKALINE PHOSPHATASE  153*  176*   PROTEIN, TOTAL  6.1  6.4   ALBUMIN  2.6*  2.8*       Imaging:         [place] Head Neck Soft Tissue    07/24/2015   Enlarged heterogeneous thyroid gland without focal nodule. Colonel Bald, MD 07/24/2015 12:33 PM       Signed by: Clyde Canterbury, MD

## 2015-07-25 ENCOUNTER — Other Ambulatory Visit: Payer: Self-pay

## 2015-07-25 LAB — HEPATIC FUNCTION PANEL
ALT: 34 U/L (ref 0–55)
AST (SGOT): 25 U/L (ref 5–34)
Albumin/Globulin Ratio: 0.7 — ABNORMAL LOW (ref 0.9–2.2)
Albumin: 2.9 g/dL — ABNORMAL LOW (ref 3.5–5.0)
Alkaline Phosphatase: 163 U/L — ABNORMAL HIGH (ref 37–106)
Bilirubin Direct: 0.2 mg/dL (ref 0.0–0.5)
Bilirubin Indirect: 0.2 mg/dL (ref 0.0–1.1)
Bilirubin, Total: 0.4 mg/dL (ref 0.2–1.2)
Globulin: 3.9 g/dL — ABNORMAL HIGH (ref 2.0–3.6)
Protein, Total: 6.8 g/dL (ref 6.0–8.3)

## 2015-07-25 LAB — BASIC METABOLIC PANEL
BUN: 10 mg/dL (ref 7.0–19.0)
CO2: 20 mEq/L — ABNORMAL LOW (ref 22–29)
Calcium: 8.8 mg/dL (ref 8.5–10.5)
Chloride: 110 mEq/L (ref 100–111)
Creatinine: 0.4 mg/dL — ABNORMAL LOW (ref 0.6–1.0)
Glucose: 87 mg/dL (ref 70–100)
Potassium: 3.8 mEq/L (ref 3.5–5.1)
Sodium: 140 mEq/L (ref 136–145)

## 2015-07-25 LAB — T3: T3: 2.43 ng/mL — ABNORMAL HIGH (ref 0.48–1.59)

## 2015-07-25 LAB — CBC
Hematocrit: 34.5 % — ABNORMAL LOW (ref 37.0–47.0)
Hgb: 11.4 g/dL — ABNORMAL LOW (ref 12.0–16.0)
MCH: 26.6 pg — ABNORMAL LOW (ref 28.0–32.0)
MCHC: 33 g/dL (ref 32.0–36.0)
MCV: 80.4 fL (ref 80.0–100.0)
MPV: 10.2 fL (ref 9.4–12.3)
Nucleated RBC: 0 /100 WBC (ref 0–1)
Platelets: 388 10*3/uL (ref 140–400)
RBC: 4.29 10*6/uL (ref 4.20–5.40)
RDW: 15 % (ref 12–15)
WBC: 10.62 10*3/uL (ref 3.50–10.80)

## 2015-07-25 LAB — GFR: EGFR: >60

## 2015-07-25 LAB — MAGNESIUM: Magnesium: 1.8 mg/dL (ref 1.6–2.6)

## 2015-07-25 LAB — T4, FREE: T4 Free: 3.34 ng/dL — ABNORMAL HIGH (ref 0.70–1.48)

## 2015-07-25 MED ORDER — FAMOTIDINE 20 MG PO TABS
20.0000 mg | ORAL_TABLET | Freq: Two times a day (BID) | ORAL | Status: DC | PRN
Start: 2015-07-25 — End: 2015-07-25

## 2015-07-25 MED ORDER — PREDNISONE 10 MG PO TABS
15.0000 mg | ORAL_TABLET | Freq: Every day | ORAL | 0 refills | Status: DC
Start: 2015-07-29 — End: 2015-07-25
  Filled 2015-07-25 (×2): qty 4.5, 3d supply, fill #0

## 2015-07-25 MED ORDER — PREDNISONE 5 MG PO TABS
ORAL_TABLET | ORAL | 0 refills | Status: DC
Start: 2015-07-25 — End: 2016-05-08
  Filled 2015-07-25: qty 30, 12d supply, fill #0

## 2015-07-25 MED ORDER — PREDNISONE 20 MG PO TABS
20.0000 mg | ORAL_TABLET | Freq: Every morning | ORAL | 0 refills | Status: DC
Start: 2015-07-25 — End: 2015-07-25
  Filled 2015-07-25 (×2): qty 3, 3d supply, fill #0

## 2015-07-25 MED ORDER — PREDNISONE 10 MG PO TABS
10.0000 mg | ORAL_TABLET | Freq: Every morning | ORAL | 0 refills | Status: DC
Start: 2015-08-02 — End: 2015-07-25
  Filled 2015-07-25 (×2): qty 3, 3d supply, fill #0

## 2015-07-25 MED ORDER — METHIMAZOLE 10 MG PO TABS
20.0000 mg | ORAL_TABLET | Freq: Two times a day (BID) | ORAL | 0 refills | Status: AC
Start: 2015-07-25 — End: ?
  Filled 2015-07-25 (×2): qty 60, 15d supply, fill #0

## 2015-07-25 MED ORDER — OXYCODONE HCL 5 MG PO TABS
5.0000 mg | ORAL_TABLET | ORAL | Status: DC | PRN
Start: 2015-07-25 — End: 2015-07-25

## 2015-07-25 MED ORDER — PROPRANOLOL HCL 10 MG PO TABS
10.0000 mg | ORAL_TABLET | Freq: Three times a day (TID) | ORAL | 0 refills | Status: AC
Start: 2015-07-25 — End: ?
  Filled 2015-07-25 (×2): qty 30, 10d supply, fill #0

## 2015-07-25 MED ORDER — BENZONATATE 200 MG PO CAPS
200.0000 mg | ORAL_CAPSULE | Freq: Three times a day (TID) | ORAL | 0 refills | Status: AC | PRN
Start: 2015-07-25 — End: ?
  Filled 2015-07-25: fill #0
  Filled 2015-07-25: qty 42, 14d supply, fill #0

## 2015-07-25 MED ORDER — PREDNISONE 5 MG PO TABS
5.0000 mg | ORAL_TABLET | Freq: Every morning | ORAL | 0 refills | Status: DC
Start: 2015-08-06 — End: 2015-07-25
  Filled 2015-07-25 (×2): qty 3, 3d supply, fill #0

## 2015-07-25 NOTE — Plan of Care (Signed)
Problem: Inadequate Coping  Goal: Verbalizes adaptive coping mechanisms  Intervention: Encourage participation in age appropriate diversional activities.  Pt has productive cough, tessalon pearls given this am by RN with relief.   No respiratory distress noted.   VS stable. D/c instruction given to pt and she verbalize understanding.   Pt awaiting prescriptions filled by OP pharmacy to be d/c home.  D/c iv sl.

## 2015-07-25 NOTE — Discharge Instructions (Signed)
Hyperthyroidism    You have hyperthyroidism. This means you have a thyroid gland that makes too much thyroid hormone. This hormone is vital to body growth and metabolism. If you make too much thyroid hormone, many body processes speed up and may not work right. This can cause symptoms throughout the body.  There are a number of causes of hyperthyroidism. The most common cause is Grave's disease. This occurs when the body's immune system causes the thyroid to grow and make more thyroid hormone than needed.  Symptoms of hyperthyroidism include:   Nervousness, anxiety, irritability   Shaking (tremors) that affects the hands and fingers   Weight loss despite having a normal or increased appetite   Low tolerance to heat   Sweating more than normal   Fast or irregular heartbeat   Lighter or irregular periods (women only)   More frequent bowel movements   Enlarged thyroid gland (goiter)   Bulging eyes   Problems sleeping   Muscle weakness   Fatigue   Swelling of the hands, ankles, or feet (older adults only)  Treatment for hyperthyroidism may include taking medicines. For instance, antithyroid medicines may be prescribed. These help lower the amount of thyroid hormone made by the thyroid gland. Beta-blockers may be prescribed as well. Tips for taking medicines are given below.  Radioiodine ablation or surgery may also be advised. Your healthcare provider will tell you more about these options if needed.  Home care  Tips for taking medicines   Take any medicines you're prescribed as directed.   Take your medicine at the same times each day.   Use a pillbox labeled with the days of the week. This will help you remember to take your medicine each day.   Tell your provider if you have any side effects from your medicines that bother you.   Never stop taking medicines on your own. If you do, your symptoms will return.  General care   Always talk with your provider before trying other medicines or  treatments for your thyroid problem.   If you see other healthcare providers, be sure to let them know about your thyroid problem.  Follow-up care  See your healthcare provider for checkups as advised. You may need regular tests to check the level of thyroid hormone in your blood.  When to seek medical advice  Call your healthcare provider right away if any of these occur:   New symptoms occur   Symptoms return, continue, or worsen even after treatment   Extreme fatigue   Puffy hands, face, or feet   Confusion  Call 911  Call 911 right away if any of these occur:   Fainting   Chest pain   Shortness of breath or trouble breathing  Date Last Reviewed: 01/17/2014   2000-2016 [employer] [address], [place] [address]. All rights reserved. This information is not intended as a substitute for professional medical care. Always follow your healthcare professional's instructions.

## 2015-07-25 NOTE — Discharge Instr - Activity (Signed)
As tolerated

## 2015-07-25 NOTE — Progress Notes (Signed)
Attending Attestation:     I have seen and personally examined the patient.  I agree with the findings and exam as documented by Dr. Ulla Potash.    Plan:  Thyrotoxicosis, at the current time patient is hemodynamically stable, endocrinology recommendation is appreciated, the patient would be discharge on methimazole 30 mg twice a day, propranolol, steroid taper.  Parotitis, improving  Plan of discharge and follow-up discussed with patient in detail time spent is 36 minutes this time exclude rounding at residents in teaching sessions.    Disposition:     Today    Lucilla Edin, MD

## 2015-07-25 NOTE — Plan of Care (Signed)
Problem: Safety  Goal: Patient will be free from injury during hospitalization  Intervention: Ensure appropriate safety devices are available at the bedside  Bed in lowest position, wheels locked, call light in reach, fall mats in place

## 2015-07-25 NOTE — Discharge Summary (Signed)
MEDICINE DISCHARGE SUMMARY    Date Time: 07/25/2015 11:32 AM  Patient Name: [name] Carrillo,[name] Carrillo  Attending Physician: Lucilla Edin, MD  Primary Care Physician: Corliss Parish, MD    Date of Admission: 07/22/2015  Date of Discharge: 07/25/2015     Discharge Diagnoses:     Principal Diagnosis (Diagnosis after study, that is chiefly responsible for admission to inpatient status): Thyrotoxicosis  Active Hospital Problems    Diagnosis POA   . Principal Problem: Thyrotoxicosis Yes   . Parotitis Yes   . Hyperthyroidism Yes     Chronic   . Hypokalemia Yes   . Hyponatremia Yes   . Elevated alkaline phosphatase level Yes      Resolved Hospital Problems    Diagnosis POA   No resolved problems to display.       Disposition:      Home with family    Pending Results, Recommendations & Instructions to providers after discharge:     1. Micro / Labs / Path pending:   Unresulted Labs     Procedure . . . Date/Time    Thyroid Peroxidase and Thyroglobulin Ab [[id number]] Collected:  07/23/15 0506     Updated:  07/23/15 0520    Thyroid stimulating immunoglobulin [[id number]] Collected:  07/23/15 0506    Specimen Information:  Blood Updated:  07/23/15 0520        2. Wound Care Instructions: none    Recent Labs - Last 2:         Recent Labs  Lab 07/25/15  0542 07/24/15  0907   WBC 10.62 9.25   HGB 11.4* 10.4*   HEMATOCRIT 34.5* 31.8*   PLATELETS 388 328             Recent Labs  Lab 07/25/15  0542 07/24/15  0907 07/23/15  1425   ALKALINE PHOSPHATASE 163* 153* 176*   BILIRUBIN, TOTAL 0.4 0.4 0.5   BILIRUBIN, DIRECT 0.2  --  0.2   PROTEIN, TOTAL 6.8 6.1 6.4   ALBUMIN 2.9* 2.6* 2.8*   ALT 34 36 49   AST (SGOT) 25 29 62*        Recent Labs  Lab 07/25/15  0542 07/24/15  0907  07/22/15  1428   SODIUM 140 140 More results in Results Review 131*   POTASSIUM 3.8 3.5 More results in Results Review 2.9*   CHLORIDE 110 112* More results in Results Review 98*   CO2 20* 19* More results in Results Review 19*   BUN 10.0 8.0 More results in Results Review  7.0   GLUCOSE 87 107* More results in Results Review 103*   CALCIUM 8.8 8.6 More results in Results Review 8.6   MAGNESIUM 1.8  --   --  2.2   More results in Results Review = values in this interval not displayed.          Recent Labs  Lab 07/23/15  0506 07/22/15  1428   THYROID STIMULATING HORMONE  --  <0.01*   T3, FREE 21.43*  --             Procedures/Radiology performed:   Chest 2 Views    07/22/2015  There is no evidence of acute disease. Stephannie Peters, MD 07/22/2015 3:11 PM     [place] Head Neck Soft Tissue    07/24/2015   Enlarged heterogeneous thyroid gland without focal nodule. Colonel Bald, MD 07/24/2015 12:33 PM     US Abdomen Limited Ruq    07/23/2015  1. The echotexture of the liver is within normal limits.. There is no intrahepatic biliary ductal dilatation or mass. 2. No gallstones or evidence of acute cholecystitis. 3. There is no hydronephrosis within the right kidney. The remainder is as above. Stephannie Peters, MD 07/23/2015 8:47 AM          Hospital Course:     Reason for admission/ HPI:   [name] Carrillo is a 25 y.o. female w/ PMH significant for hyperthyroidism who presented to the hospital on 07/22/2015 with 2 days of worsening SOB, cough, subjective, facial swelling, preceded by 2 weeks of night sweats, and chronic progressive fatigue. The patient states waking up 2 days ago and noting painful swelling in her cheeks. She has never had these symptoms before. Tylenol and aleve provided some relief.   She denies pain with chewing. She endorses dry mouth, but not dry eyes.   She practices good oral hygiene. She denies sick contacts, recent travel, but [place] ago. She states her immunizations are UTD with exception of flu. She has never been on amiodarone.  She was initially diagnosed with hyperthyroidism in October 2015 by endocrinologist Dr. Barbra Sarks [facility]), an ultrasound then showed enlarged hypervascular heterogeneous thyroid gland suggestive of  Graves' disease and mildly enlarged neck lymph nodes as described above.  Recommendation was made to start propranolol 60mg  PO daily qhs, methimazole 1mg  BID. She has not continued follow up or treatment for it due to insurance issues. She stopped taking her meds because they "did not make her feel good."  In the ED on the day of admission, she was noted to have TSH <0.01, Free T4 >6; also with mild hyponatremia to 131, hypokalemia to 2.9.  Patient was started on methimazole, propranolol, potassium was repleted with Kdur and NS infusion with KCl was started.      Hospital Course:   #Thyrotoxicosis: Once started on tapazol and propranolol, her sinus tachycardia has improved, free T4 downtrended with treatment. Etiology was be viral thyroiditis given URI symptoms and tender thyroid on exam. Her prednisone will be tapered upon discharge.Discharge T3 and T4 were 2.4 and 3.3 respectively. Thyroid ultrasound results are as above.    #Parotitis: possibly viral, improved over the course of her admission.     #Hyponatremia: resolved, possibly related to thyrotoxicosis. Na 131 on admission, 138 yesterday.    #Hypokalemia: resolved, likely related to #1. K 2.9 on admission, resolved with repletion.    #Elevated alk phos: likely related to #1. Improved (222 on admission, 176 yesterday). RUQ u/s wnl.    Discharge Day Exam:  Temp:  [94.6 F (34.8 C)-98.2 F (36.8 C)] 97.3 F (36.3 C)  Heart Rate:  [82-100] 82  Resp Rate:  [18-20] 18  BP: (112-133)/(56-71) 121/67 mmHg  Gen: aox3, nad  HEENT: no proptosis or lid lag; thyroid bruit present; thyroid mildly tender to palpation  CV: RRR no m/r/g  Lungs: CTAB  Abdomen: SNTND  Extremities: no edema  Wounds/decutibus ulcers/stage:    Consultations:     Treatment Team:   Attending Provider: Lucilla Edin, MD  Resident: Garnett Farm, MD  Consulting Physician: Lucilla Edin, MD  Resident: Ellison Hughs, MD  Resident: Ulla Potash, Janean Sark, MD  Resident: Smith Robert,  MD    Discharge Condition:     Stable    Discharge Instructions & Follow Up Plan for Patient:     Diet: regular    Activity/Weight Bearing  Status: as tolerated      Patient was instructed to follow up with:     Follow-up Information     Follow up with [facility] On 08/04/2015.    Why:  10am appointment. Follow up of hospitalization for thyrotoxicosis. Repeat LFT (patient on methimazole).     Contact information:    [address]  [place] [address]  [phone]        Follow up with Clyde Canterbury, MD. Schedule an appointment as soon as possible for a visit in 1 week.    Specialties:  Endocrinology, Diabetes and Metabolism, Internal Medicine    Why:  Follow up of thyrotoxicosis    Contact information:    [address]  Department of Medicine  [facility] [address]  [phone]              Discharge Code Status: full code  Patient Emergency Contact: Extended Emergency Contact Information  Primary Emergency Contact: Sandra,Clearance   [place]  Home Phone: [phone]  Relation: Mother     Complete instructions and follow up are in the patient's After Visit Summary    Minutes spent coordinating discharge and reviewing discharge plan: 15 minutes    Discharge Medications:        Discharge Medication List      Taking          benzonatate 200 MG capsule   Dose:  200 mg   Commonly known as:  TESSALON   Take 1 capsule (200 mg total) by mouth 3 (three) times daily as needed for Cough.       methIMAzole 10 MG tablet   Dose:  20 mg   Commonly known as:  TAPAZOLE   For:  Overactive Thyroid Gland, Overactive Thyroid causing Life-Threatening Symptoms   Take 2 tablets (20 mg total) by mouth 2 (two) times daily.       predniSONE 5 MG tablet   Commonly known as:  DELTASONE   Take 4 tabs by mouth in the morning with breackfast x 3days, then 3 tabs daily x 3days, then 2 tabs daily x3 days then 1 tab daily x 3days       propranolol 10 MG tablet   Dose:   10 mg   Commonly known as:  INDERAL   Take 1 tablet (10 mg total) by mouth every 8 (eight) hours.           [facility]  Department of Medicine  P: [phone]  F: [phone]    Signed by: Barbra Sarks, MD    CC: Johney Maine, Theodosia Blender, MD

## 2015-07-26 LAB — THYROID STIMULATING IMMUNOGLOBULIN: Thyroid Stimulating Immunoglobulin: 257 — ABNORMAL HIGH (ref <140)

## 2015-07-26 LAB — THYROID PEROXIDASE ANTIBODY: Anti-thyroid peroxidase AB: 696 — ABNORMAL HIGH (ref <9)

## 2015-07-28 LAB — THYROID PEROXIDASE AND THYROGLOBULIN AB
Anti-thyroid peroxidase AB: 570 — ABNORMAL HIGH (ref <9)
Thyroglobulin AB: 43 — ABNORMAL HIGH (ref <=1)

## 2015-07-29 ENCOUNTER — Other Ambulatory Visit: Payer: Self-pay

## 2015-07-29 ENCOUNTER — Emergency Department: Payer: Charity

## 2015-07-29 ENCOUNTER — Emergency Department
Admission: EM | Admit: 2015-07-29 | Discharge: 2015-07-29 | Disposition: A | Discharge status: Home / Self Care | Payer: Charity | Attending: Emergency Medicine | Admitting: Emergency Medicine

## 2015-07-29 DIAGNOSIS — E0590 Thyrotoxicosis, unspecified without thyrotoxic crisis or storm: Secondary | ICD-10-CM | POA: Insufficient documentation

## 2015-07-29 DIAGNOSIS — H9213 Otorrhea, bilateral: Secondary | ICD-10-CM | POA: Insufficient documentation

## 2015-07-29 DIAGNOSIS — J189 Pneumonia, unspecified organism: Secondary | ICD-10-CM | POA: Insufficient documentation

## 2015-07-29 LAB — CBC AND DIFFERENTIAL
Basophils Absolute Automated: 0.02 10*3/uL (ref 0.00–0.20)
Basophils Automated: 0 %
Eosinophils Absolute Automated: 0.01 10*3/uL (ref 0.00–0.70)
Eosinophils Automated: 0 %
Hematocrit: 36.2 % — ABNORMAL LOW (ref 37.0–47.0)
Hgb: 12.3 g/dL (ref 12.0–16.0)
Immature Granulocytes Absolute: 0.14 10*3/uL — ABNORMAL HIGH
Immature Granulocytes: 1 %
Lymphocytes Absolute Automated: 2.55 10*3/uL (ref 0.50–4.40)
Lymphocytes Automated: 10 %
MCH: 26.9 pg — ABNORMAL LOW (ref 28.0–32.0)
MCHC: 34 g/dL (ref 32.0–36.0)
MCV: 79.2 fL — ABNORMAL LOW (ref 80.0–100.0)
MPV: 9.5 fL (ref 9.4–12.3)
Monocytes Absolute Automated: 1.2 10*3/uL (ref 0.00–1.20)
Monocytes: 5 %
Neutrophils Absolute: 22.09 10*3/uL — ABNORMAL HIGH (ref 1.80–8.10)
Neutrophils: 85 %
Nucleated RBC: 0 /100 WBC (ref 0–1)
Platelets: 557 10*3/uL — ABNORMAL HIGH (ref 140–400)
RBC: 4.57 10*6/uL (ref 4.20–5.40)
RDW: 14 % (ref 12–15)
WBC: 26.01 10*3/uL — ABNORMAL HIGH (ref 3.50–10.80)

## 2015-07-29 LAB — ECG 12-LEAD
Atrial Rate: 107 {beats}/min
P Axis: 24 degrees
P-R Interval: 140 ms
Q-T Interval: 342 ms
QRS Duration: 86 ms
QTC Calculation (Bezet): 456 ms
R Axis: 25 degrees
T Axis: 43 degrees
Ventricular Rate: 107 {beats}/min

## 2015-07-29 LAB — URINALYSIS, REFLEX TO MICROSCOPIC EXAM IF INDICATED
Bilirubin, UA: NEGATIVE
Glucose, UA: NEGATIVE
Ketones UA: NEGATIVE
Nitrite, UA: NEGATIVE
Protein, UR: 30 — AB
Specific Gravity UA: 1.013 (ref 1.001–1.035)
Urine pH: 7 (ref 5.0–8.0)
Urobilinogen, UA: 4 mg/dL (ref 0.2–2.0)

## 2015-07-29 LAB — GFR: EGFR: >60

## 2015-07-29 LAB — POCT PREGNANCY TEST, URINE HCG: POCT Pregnancy HCG Test, UR: NEGATIVE

## 2015-07-29 LAB — BASIC METABOLIC PANEL
BUN: 5 mg/dL — ABNORMAL LOW (ref 7.0–19.0)
CO2: 17 mEq/L — ABNORMAL LOW (ref 22–29)
Calcium: 9.6 mg/dL (ref 8.5–10.5)
Chloride: 103 mEq/L (ref 100–111)
Creatinine: 0.5 mg/dL — ABNORMAL LOW (ref 0.6–1.0)
Glucose: 123 mg/dL — ABNORMAL HIGH (ref 70–100)
Potassium: 4.1 mEq/L (ref 3.5–5.1)
Sodium: 133 mEq/L — ABNORMAL LOW (ref 136–145)

## 2015-07-29 MED ORDER — AZITHROMYCIN 250 MG PO TABS
500.0000 mg | ORAL_TABLET | Freq: Once | ORAL | Status: AC
Start: 2015-07-29 — End: 2015-07-29
  Administered 2015-07-29: 500 mg via ORAL
  Filled 2015-07-29: qty 2

## 2015-07-29 MED ORDER — SODIUM CHLORIDE 0.9 % IV MBP
1.0000 g | Freq: Once | INTRAVENOUS | Status: AC
Start: 2015-07-29 — End: 2015-07-29
  Administered 2015-07-29: 1 g via INTRAVENOUS
  Filled 2015-07-29: qty 1000

## 2015-07-29 MED ORDER — AZITHROMYCIN 250 MG PO TABS
250.0000 mg | ORAL_TABLET | Freq: Every day | ORAL | 0 refills | Status: AC
Start: 2015-07-29 — End: 2015-08-02
  Filled 2015-07-29: qty 4, 4d supply, fill #0

## 2015-07-29 MED ORDER — SODIUM CHLORIDE 0.9 % IV BOLUS
1000.0000 mL | Freq: Once | INTRAVENOUS | Status: AC
Start: 2015-07-29 — End: 2015-07-29
  Administered 2015-07-29: 1000 mL via INTRAVENOUS

## 2015-07-29 MED ORDER — ONDANSETRON HCL 4 MG/2ML IJ SOLN
4.0000 mg | Freq: Once | INTRAMUSCULAR | Status: AC
Start: 2015-07-29 — End: 2015-07-29
  Administered 2015-07-29: 4 mg via INTRAVENOUS
  Filled 2015-07-29: qty 2

## 2015-07-29 NOTE — Discharge Instructions (Signed)
Dear [name] Carrillo:    Thank you for choosing the [facility] Emergency Department, the premier emergency department in the [place] area.  I hope your visit today was EXCELLENT.    Specific instructions for your visit today:    Follow up with Transitional Care as scheduled.    Pneumonia    You have been diagnosed with pneumonia.    Pneumonia is an infection of the small air tubes and sacs of the lungs. It can be caused by a virus, bacteria or fungus.     Symptoms include fever (temperature higher than 100.48F / 38C), shortness of breath and a cough that produces a green or yellow material. You may have chest pain, vomiting, or headache.    Some people with pneumonia must stay in the hospital. Patients with certain risk factors, like diabetes, cancer, alcoholism, or another chronic medical condition, can be difficult to treat. The physician has determined that it is OK for you to go home.    Follow up closely with your doctor.    Do not smoke. Smoking may cause your pneumonia symptoms to become worse. Smoking also causes heart disease, cancer, and birth defects. If you smoke, ask your doctor for ideas about how to quit.   If you do not smoke, avoid others who do. Smoke will irritate your lungs and make your breathing worse while you have pneumonia.    YOU SHOULD SEEK MEDICAL ATTENTION IMMEDIATELY, EITHER HERE OR AT THE NEAREST EMERGENCY DEPARTMENT, IF ANY OF THE FOLLOWING OCCURS:   You wheeze or have trouble breathing.   You have a fever (temperature higher than 100.48F / 38C) that won't go away.   You cough up blood.   You have chest pain.   You vomit and cannot keep medications down or you feel dizzy, weak or confused.   You feel worse or don't improve in 2 to 3 days.   You are unable to perform your normal activities.                If you do not continue to improve or your condition worsens, please contact your doctor or return immediately to the Emergency  Department.    Sincerely,  Olevia Bowens, MD  Attending Emergency Physician  [facility] Emergency Department    ONSITE PHARMACY  Our full service onsite pharmacy is located in the ER waiting room.  Open 7 days a week from 9 am to 11 pm.  We accept all major insurances and prices are competitive with major retailers.  Ask your provider to print your prescriptions down to the pharmacy to speed you on your way home.    OBTAINING A PRIMARY CARE APPOINTMENT    Primary care physicians (PCPs, also known as primary care doctors) are either internists or family medicine doctors. Both types of PCPs focus on health promotion, disease prevention, patient education and counseling, and treatment of acute and chronic medical conditions.    Call for an appointment with a primary care doctor.  Ask to see who is taking new patients.     [facility]  telephone:  [phone]  [email or web address]    DOCTOR REFERRALS  Call [phone] (available 24 hours a day, 7 days a week) if you need any further referrals and we can help you find a primary care doctor or specialist.  Also, available online at:  [email or web address]    YOUR CONTACT INFORMATION  Before leaving please check with registration to make sure  we have an up-to-date contact number.  You can call registration at [phone] to update your information.  For questions about your hospital bill, please call [phone].  For questions about your Emergency Dept Physician bill please call [phone].      [place]  If you need help with health or social services, please call 2-1-1 for a free referral to resources in your area.  2-1-1 is a free service connecting people with information on health insurance, free clinics, pregnancy, mental health, dental care, food assistance, housing, and substance abuse counseling.  Also, available online at:  http://www.[place].org    MEDICAL RECORDS AND TESTS  Certain laboratory  test results do not come back the same day, for example urine cultures.   We will contact you if other important findings are noted.  Radiology films are often reviewed again to ensure accuracy.  If there is any discrepancy, we will notify you.      Please call [phone] to pick up a complimentary CD of any radiology studies performed.  If you or your doctor would like to request a copy of your medical records, please call [phone].      ORTHOPEDIC INJURY   Please know that significant injuries can exist even when an initial x-ray is read as normal or negative.  This can occur because some fractures (broken bones) are not initially visible on x-rays.  For this reason, close outpatient follow-up with your primary care doctor or bone specialist (orthopedist) is required.    MEDICATIONS AND FOLLOWUP  Please be aware that some prescription medications can cause drowsiness.  Use caution when driving or operating machinery.    The examination and treatment you have received in our Emergency Department is provided on an emergency basis, and is not intended to be a substitute for your primary care physician.  It is important that your doctor checks you again and that you report any new or remaining problems at that time.      [place]  The nearest 24 hour pharmacy is:    [facility] [address], [place] [address]  [place]  [facility])  Call to start or finish an application, compare plans, enroll or ask a question.  [place]: [phone]  Web:  Healthcare.gov    Help Enrolling in [place]  418-377-7704 (TOLL-FREE)  940-045-9678 (TTY)  Web:  Http://www.coverva.org    Local Help Enrolling in the [place]  [phone] (MAIN)  Email:  health-help@nvfs .org  Web:  http://lewis-perez.info/  Address:  [address] [place], [place] [address]    SEDATING  MEDICATIONS  Sedating medications include strong pain medications (e.g. narcotics), muscle relaxers, benzodiazepines (used for anxiety and as muscle relaxers), Benadryl/diphenhydramine and other antihistamines for allergic reactions/itching, and other medications.  If you are unsure if you have received a sedating medication, please ask your physician or nurse.  If you received a sedating medication: DO NOT drive a car. DO NOT operate machinery. DO NOT perform jobs where you need to be alert.  DO NOT drink alcoholic beverages while taking this medicine.     If you get dizzy, sit or lie down at the first signs. Be careful going up and down stairs.  Be extra careful to prevent falls.     Never give this medicine to others.  Keep this medicine out of reach of children.     Do not take or save old medicines. Throw them away when outdated.     Keep all medicines in a cool, dry place. DO NOT keep them in your bathroom medicine cabinet or in a cabinet above the stove.    MEDICATION REFILLS  Please be aware that we cannot refill any prescriptions through the ER. If you need further treatment from what is provided at your ER visit, please follow up with your primary care doctor or your pain management specialist.    FREESTANDING EMERGENCY DEPARTMENTS OF [facility]  Did you know [facility] has two freestanding ERs located just a few miles away?  [facility] [place] and [facility] [place] [facility] [place] have short wait times, easy free parking directly in front of the building and top patient satisfaction scores - and the same Board Certified Emergency Medicine doctors as [facility].              [place]  [id number]  [id number]  [phone]  07/29/2015    Discharge Instructions    As always, you are the most important factor in your recovery.  Please follow these instructions carefully.  If you have problems that we have not discussed, CALL OR VISIT YOUR DOCTOR RIGHT AWAY.     If you can't reach your  doctor, return to the emergency department.    [employer] understand the written and discussed instructions.  My questions have been answered.  I acknowledge receipt of these instructions.     Patient or responsible person:         Patient's Signature               Physician or Nurse

## 2015-07-29 NOTE — ED Notes (Signed)
Pt stating she feels better after fluids and requesting to be d/c'd

## 2015-07-29 NOTE — ED Provider Notes (Addendum)
Physician/Midlevel provider first contact with patient: 07/29/15 1238         History     Chief Complaint   Patient presents with   . Cough   . Chest Pain     The history is provided by the patient and medical records.    Pt is a 25 y.o. female with h/o hyperthyroidism who presents with progressively worsening cough x 3 wks. A/w b/l otalgia with ear drainage, recurrent non-bilious emesis--occasionally streaked with blood. She also c/o abdominal and chest soreness 2/2 coughing, as well as fatigue and chills. 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  Of significant note, she was recently admitted to [facility] 2/25-2/28 for (thought to be viral) thyroiditis. She was started on propranolol 60 mg PO daily qhs and methimazole 1 mg BID. She was given f/u with transitional care, which she did not pursue. She states today that she has been unable to tolerate her meds at home d/t n/v.  She denies diarrhea, worsened SOB, recurrence of facial swelling, dizziness, rash. She is o/w healthy.      Past Medical History   Diagnosis Date   . Hyperthyroidism        History reviewed. No pertinent past surgical history.    Family History   Problem Relation Age of Onset   . Cancer Father    . Cancer Sister    . Thyroid disease Neg Hx        Social  Social History   Substance Use Topics   . Smoking status: Never Smoker    . Smokeless tobacco: None   . Alcohol Use: No       .     No Known Allergies    Home Medications                   benzonatate (TESSALON) 200 MG capsule     Take 1 capsule (200 mg total) by mouth 3 (three) times daily as needed for Cough.     methIMAzole (TAPAZOLE) 10 MG tablet     Take 2 tablets (20 mg total) by mouth 2 (two) times daily.     predniSONE (DELTASONE) 5 MG tablet     Take 4 tabs by mouth in the morning with breackfast x 3days, then 3 tabs daily x 3days, then 2 tabs daily x3 days then 1 tab daily x 3days     propranolol (INDERAL) 10 MG tablet     Take 1 tablet (10 mg  total) by mouth every 8 (eight) hours.           Review of Systems   Constitutional: Positive for chills and fatigue.   HENT: Positive for ear discharge, ear pain and sinus pressure.    Eyes: Negative for pain and visual disturbance.   Respiratory: Positive for cough. Negative for shortness of breath.    Cardiovascular: Positive for chest pain. Negative for palpitations and leg swelling.   Gastrointestinal: Positive for nausea, vomiting and abdominal pain. Negative for diarrhea.   Genitourinary: Negative for dysuria, hematuria, flank pain and pelvic pain.   Musculoskeletal: Negative for back pain, neck pain and neck stiffness.   Skin: Negative for pallor and rash.   Neurological: Negative for dizziness, syncope and light-headedness.   All other systems reviewed and are negative.      Physical Exam    BP: 128/64 mmHg, Heart Rate: (!) 126, Temp: (!) 96.9 F (36.1 C), Resp Rate:  20, SpO2: 96 %, Weight: 56.7 kg    Physical Exam   Constitutional: She is oriented to person, place, and time. She appears well-developed and well-nourished. No distress.   Noted HR 126 in triage.  HR 105 at my evaluation.  NAD,  Nontoxic.     HENT:   Head: Normocephalic and atraumatic.   Patent well defined hole to L TM  R Tm w/ large defect, draining yellow clear discharge   Eyes: Conjunctivae and EOM are normal. Pupils are equal, round, and reactive to light.   Neck: Normal range of motion. Neck supple. No tracheal deviation present.   Cardiovascular: Regular rhythm and normal heart sounds.    Slightly tachycardic   Pulmonary/Chest: Effort normal and breath sounds normal. No respiratory distress.   Abdominal: Soft. She exhibits no distension. There is no tenderness.   Soft, NT   Musculoskeletal: Normal range of motion. She exhibits no edema or tenderness.   Neurological: She is alert and oriented to person, place, and time. Coordination normal.   Skin: Skin is warm and dry. No erythema.   Psychiatric: She has a normal mood and affect. Her  behavior is normal. Thought content normal.   Nursing note and vitals reviewed.        MDM and ED Course     ED Medication Orders     Start Ordered     Status Ordering Provider    07/29/15 1420 07/29/15 1419  azithromycin (ZITHROMAX) tablet 500 mg   Once     Route: Oral  Ordered Dose: 500 mg     Last MAR action:  Given Jo Booze TODD    07/29/15 1413 07/29/15 1412  cefTRIAXone (ROCEPHIN) 1 g in sodium chloride 0.9 % 100 mL IVPB mini-bag plus   Once     Route: Intravenous  Ordered Dose: 1 g     Last MAR action:  Stopped Govind Furey TODD    07/29/15 1247 07/29/15 1246  sodium chloride 0.9 % bolus 1,000 mL   Once     Route: Intravenous  Ordered Dose: 1,000 mL     Last MAR action:  Stopped Linet Brash TODD    07/29/15 1247 07/29/15 1246  ondansetron (ZOFRAN) injection 4 mg   Once     Route: Intravenous  Ordered Dose: 4 mg     Last MAR action:  Given Nijah Orlich TODD         O2 sat-           saturation: 96 %; Oxygen use: room air; Interpretation: Normal    EKG -             interpreted by me: Sinus tachycardia 107. No ST segment changes. Abnormal EKG (tachycardia).       3:02 PM On reevaluation, after abx pt appears much improved, asking to leave. Will d/c on zithromax with Transitional Care f/u as scheduled and additional ENT f/u given.      MDM  Number of Diagnoses or Management Options  Pneumonia of right lower lobe due to infectious organism:   Diagnosis management comments: Medical Decision Making      Presumptive Diagnosis: Consider PNA, Tm rupture chronic, leukocytosis    Treatment Plan: home, see patient instructions for treatment and plan    I reviewed the vital signs, nursing notes, past medical history, past surgical history, family history and social history.  No att. providers found    Vital Signs - BP 115/56 mmHg  Pulse 103  Temp(Src) 96.9  F (36.1 C)  Resp 15  Ht 5\' 1"  (1.549 m)  Wt 56.7 kg  BMI 23.63 kg/m2  SpO2 97%    Pulse Oximetry Analysis -  Normal    Differential Diagnosis (not  completely inclusive): PNA, UTI, OM, influenza, viral syndrome    Laboratory results reviewed by EDP: Yes  Radiologic study results reviewed by EDP: Yes  Radiologic Studies Interpreted (viewed) by EDP: Yes                Procedures    Clinical Impression & Disposition     Clinical Impression  Final diagnoses:   Pneumonia of right lower lobe due to infectious organism        ED Disposition     Discharge [facility] discharge to home/self care.  Pt ambulatory with independent, steady gait to POV driven by her friend  Condition at disposition: Stable             Discharge Medication List as of 07/29/2015  3:03 PM      START taking these medications    Details   azithromycin (ZITHROMAX) 250 MG tablet Take 1 tablet (250 mg total) by mouth daily., Starting 07/29/2015, Until Wed 08/02/15, Print            I was acting as a Neurosurgeon for Olevia Bowens, MD on VIGIL CLAROS,[place]  Treatment Team: Scribe: Georgeann Oppenheim     I am the first provider for this patient and I personally performed the services documented. Treatment Team: Scribe: Georgeann Oppenheim is scribing for me on VIGIL CLAROS,[place]. This note and the patient instructions accurately reflect work and decisions made by me.  Olevia Bowens, MD    Noted subtle evidence of PNA on CXR, given elevation on WBC and patients c/o cough.  Will cover w/ antibiotics.    Patient in hospital < 72 hours, technically still CAP.     After fluids and abx patient states feeling much better.  Discussed concern given elevated WBC (considered 2/2 recent steroid use).  Offered patient inpatient admission.  Patient declines.  "Oh no, I feel much better, I need to go home."  Patient understands importance to f/u w/ transitional clinic.  Patient understands return precautions.         Olevia Bowens, MD  07/29/15 1746    Call back 3-5 - Called phone provided person identified as family member answered, states she is feeling better.  I left phone number to call back if any  questions.    Olevia Bowens, MD  07/30/15 [phone]

## 2015-08-04 ENCOUNTER — Ambulatory Visit (INDEPENDENT_AMBULATORY_CARE_PROVIDER_SITE_OTHER): Payer: Charity | Admitting: Nurse Practitioner

## 2015-08-04 ENCOUNTER — Other Ambulatory Visit (INDEPENDENT_AMBULATORY_CARE_PROVIDER_SITE_OTHER): Payer: Self-pay | Admitting: Internal Medicine

## 2016-05-08 ENCOUNTER — Emergency Department: Payer: No Typology Code available for payment source

## 2016-05-08 ENCOUNTER — Emergency Department
Admission: EM | Admit: 2016-05-08 | Discharge: 2016-05-08 | Disposition: A | Discharge status: Home / Self Care | Payer: No Typology Code available for payment source | Attending: Emergency Medicine | Admitting: Emergency Medicine

## 2016-05-08 DIAGNOSIS — M542 Cervicalgia: Secondary | ICD-10-CM | POA: Insufficient documentation

## 2016-05-08 DIAGNOSIS — E0590 Thyrotoxicosis, unspecified without thyrotoxic crisis or storm: Secondary | ICD-10-CM | POA: Insufficient documentation

## 2016-05-08 DIAGNOSIS — Z79899 Other long term (current) drug therapy: Secondary | ICD-10-CM | POA: Insufficient documentation

## 2016-05-08 DIAGNOSIS — M25511 Pain in right shoulder: Secondary | ICD-10-CM | POA: Insufficient documentation

## 2016-05-08 DIAGNOSIS — L509 Urticaria, unspecified: Secondary | ICD-10-CM

## 2016-05-08 DIAGNOSIS — L500 Allergic urticaria: Secondary | ICD-10-CM | POA: Insufficient documentation

## 2016-05-08 MED ORDER — ACETAMINOPHEN 500 MG PO TABS
1000.0000 mg | ORAL_TABLET | Freq: Once | ORAL | Status: AC
Start: 2016-05-08 — End: 2016-05-08
  Administered 2016-05-08: 1000 mg via ORAL
  Filled 2016-05-08: qty 2

## 2016-05-08 MED ORDER — PREDNISONE 20 MG PO TABS
40.0000 mg | ORAL_TABLET | Freq: Every day | ORAL | 0 refills | Status: AC
Start: 2016-05-08 — End: 2016-05-10

## 2016-05-08 MED ORDER — IBUPROFEN 600 MG PO TABS
600.0000 mg | ORAL_TABLET | Freq: Once | ORAL | Status: AC
Start: 2016-05-08 — End: 2016-05-08
  Administered 2016-05-08: 600 mg via ORAL
  Filled 2016-05-08: qty 1

## 2016-05-08 MED ORDER — DIPHENHYDRAMINE HCL 25 MG PO CAPS
25.0000 mg | ORAL_CAPSULE | Freq: Once | ORAL | Status: AC
Start: 2016-05-08 — End: 2016-05-08
  Administered 2016-05-08: 25 mg via ORAL
  Filled 2016-05-08: qty 1

## 2016-05-08 MED ORDER — ACETAMINOPHEN 325 MG PO TABS
650.0000 mg | ORAL_TABLET | Freq: Four times a day (QID) | ORAL | 0 refills | Status: AC | PRN
Start: 2016-05-08 — End: ?

## 2016-05-08 MED ORDER — PREDNISONE 20 MG PO TABS
40.0000 mg | ORAL_TABLET | Freq: Once | ORAL | Status: AC
Start: 2016-05-08 — End: 2016-05-08
  Administered 2016-05-08: 40 mg via ORAL
  Filled 2016-05-08: qty 2
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
## 2021-08-14 IMAGING — MR MRI SHOULDER LT WO CONTRAST
5 of 6 series · 34 of 40 positions shown · non-contrast
Comparison: None.

MRI SHOULDER LT WO CONTRAST
INDICATION: Pain in left shoulder .
TECHNIQUE: Multiplanar, multiecho imaging of the left shoulder was performed, including T1-weighted and fluid sensitive sequences without intravenous contrast.

[Series 2: t2_axial_fs · axial · 4.0mm · 0.62mm/px · z∈[-45,+38]mm · 6 of 20 slices shown]
[im 1/20]
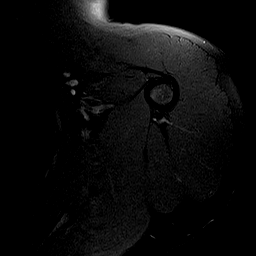
[im 4/20]
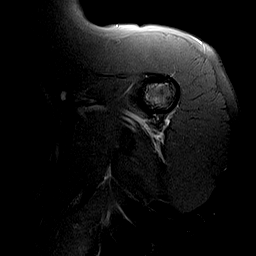
[im 8/20]
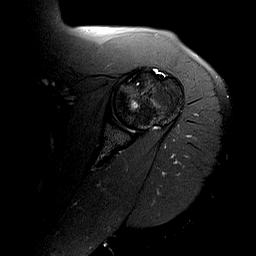
[im 12/20]
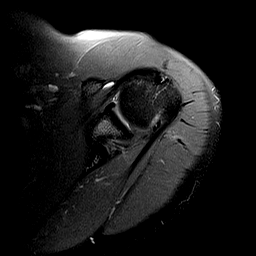
[im 16/20]
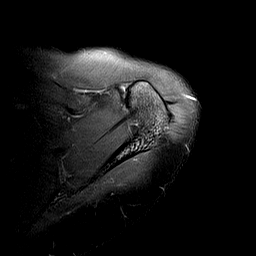
[im 20/20]
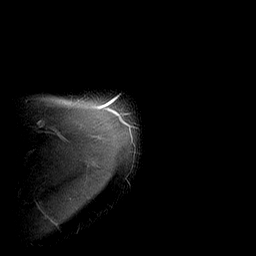

[Series 3: t2_cor_obl_fs · oblique · 4.0mm · 0.50mm/px · 6 of 18 slices shown]
[im 1/18]
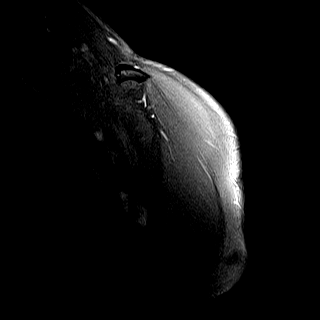
[im 4/18]
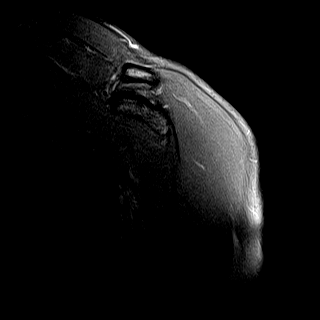
[im 7/18]
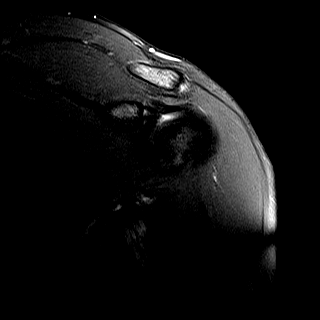
[im 11/18]
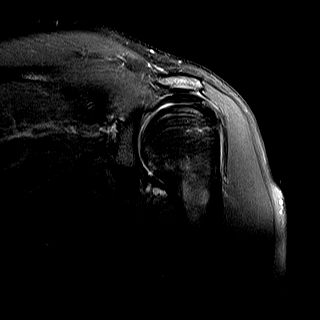
[im 14/18]
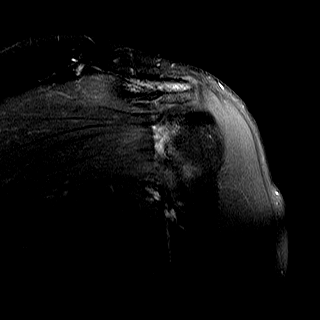
[im 18/18]
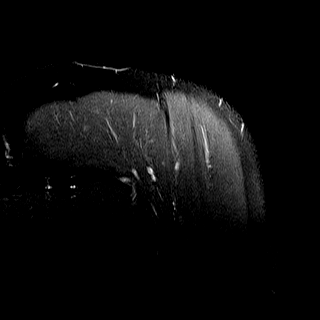

[Series 4: t2_cor_obl_fs_blade · oblique · 4.0mm · 0.62mm/px · 6 of 18 slices shown]
[im 1/18]
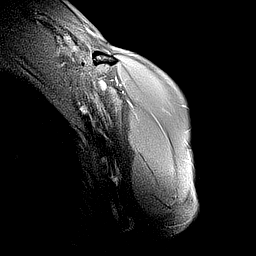
[im 4/18]
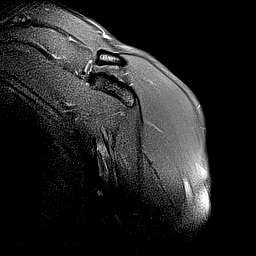
[im 7/18]
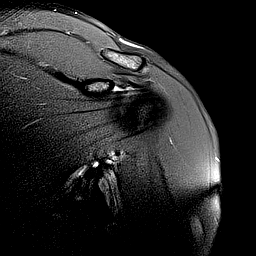
[im 11/18]
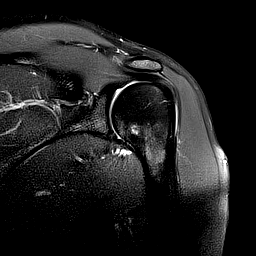
[im 14/18]
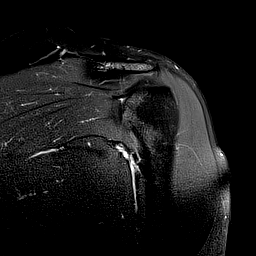
[im 18/18]
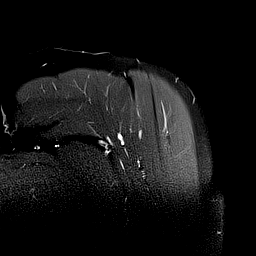

[Series 5: t2_sag_obl_fs · oblique · 4.0mm · 0.50mm/px · 8 of 26 slices shown]
[im 1/26]
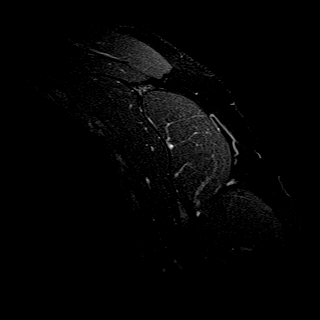
[im 4/26]
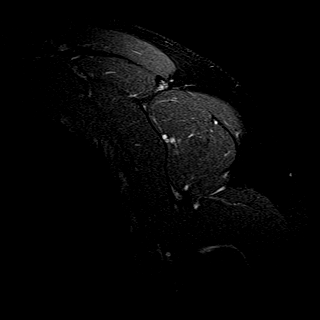
[im 8/26]
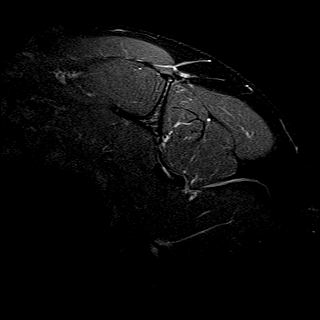
[im 11/26]
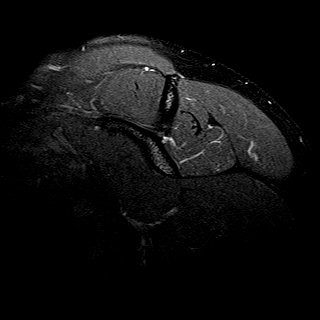
[im 15/26]
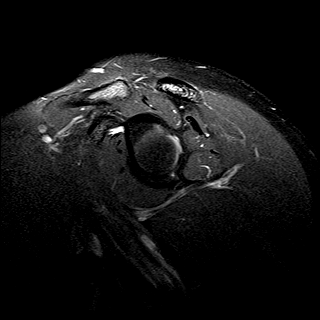
[im 18/26]
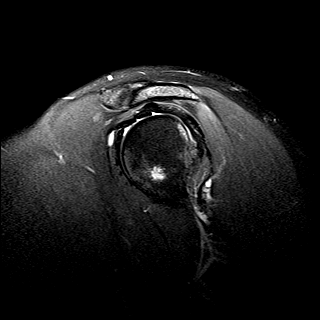
[im 22/26]
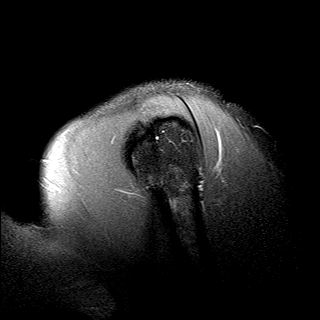
[im 26/26]
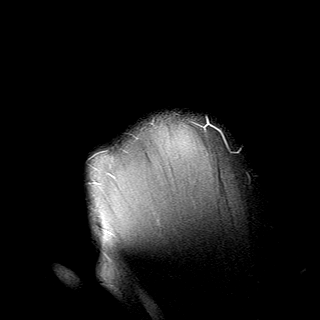

[Series 7: t1_sag_obl_blade · oblique · 4.0mm · 0.62mm/px · 8 of 26 slices shown]
[im 1/26]
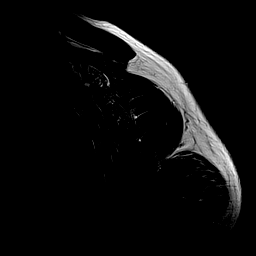
[im 4/26]
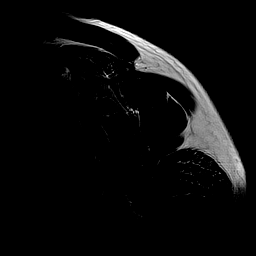
[im 8/26]
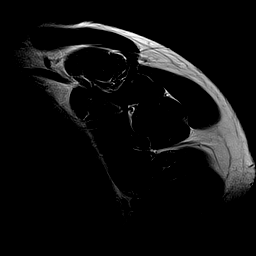
[im 11/26]
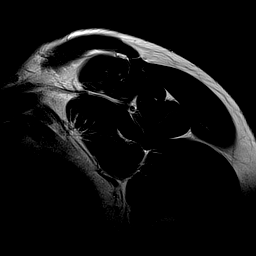
[im 15/26]
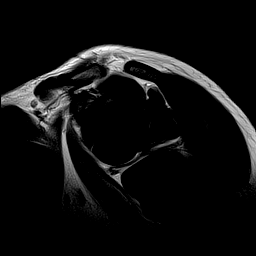
[im 18/26]
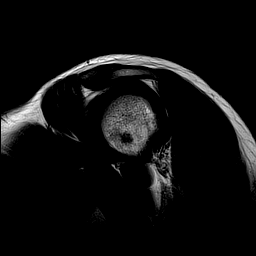
[im 22/26]
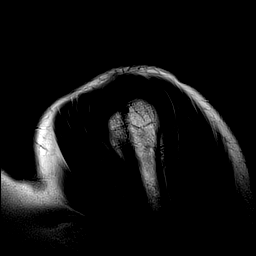
[im 26/26]
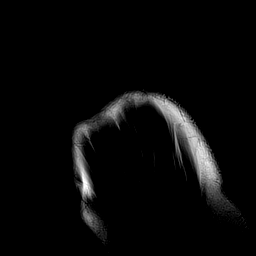

[34 of 40 positions shown; findings below may reference images not displayed]

FINDINGS: Rotator cuff:

Supraspinatus and infraspinatus: Intact.

Subscapularis: Intact.

Teres minor: Intact. 

Cuff muscles: Normal in size and signal intensity.  No fatty infiltration.

Acromioclavicular joint: Normal.

JOSE LUIS bursa: No bursitis.

Long head biceps tendon: Intact, with normal course.

Rotator Interval: No scar or obliteration of fat.

Labrum: Intact.

Cartilage: Intact.

Marrow: Small chronic mildly depressed small chronic impaction fracture at the posterior humeral head (Hill-Sachs lesion).

No additional findings.
IMPRESSION: 1.
Small chronic mildly depressed impaction fracture at the posterior humeral head (Hill-Sachs lesion).

2.
Intact labrum.

3.
Intact rotator cuff tendons.

4.
Intact glenohumeral and AC joints.

## 2021-09-21 IMAGING — CR [HOSPITAL] SKULL
1 series · 2 of 2 positions shown · non-contrast
Comparison: None.

HISTORY: MRI Clearance.
TECHNIQUE: Skull x-ray 2 views.

[Series 1: PA · 0.17mm/px · 2 of 2 slices shown]
[im 1/2]
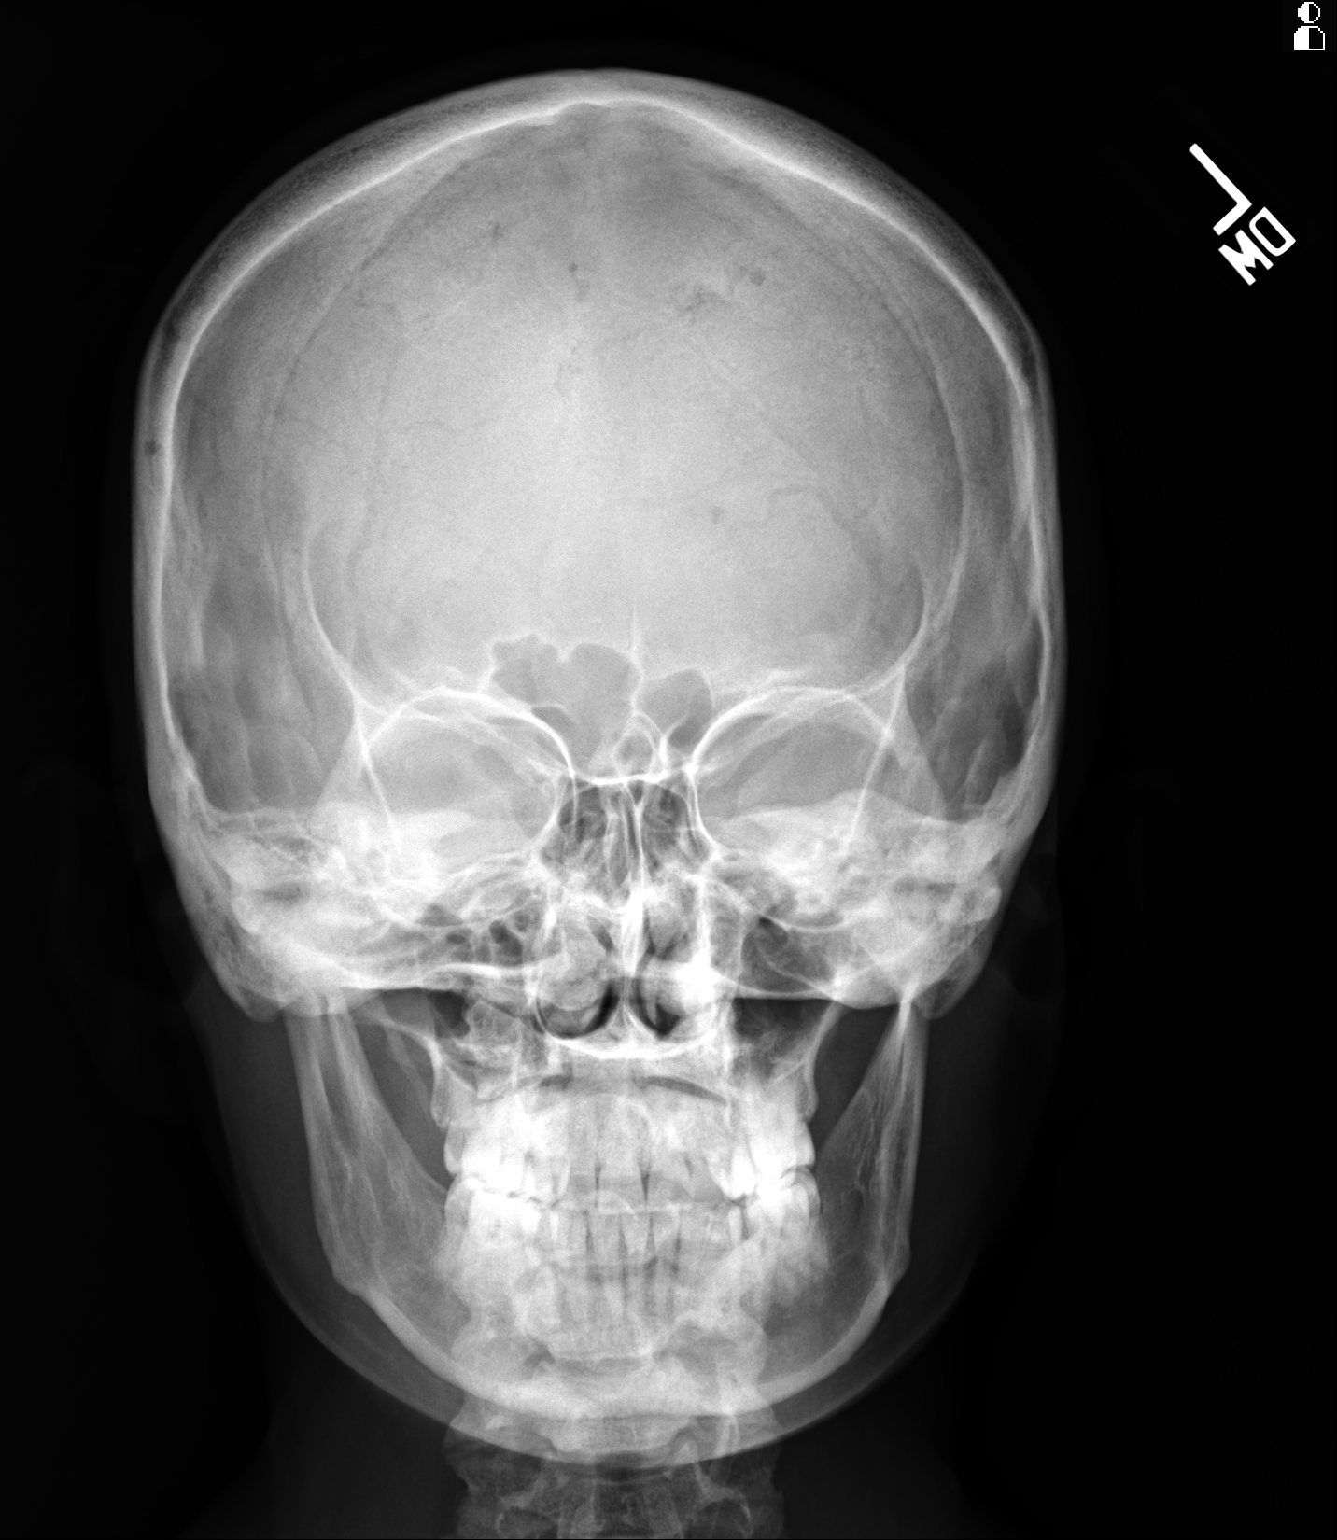
[im 2/2]
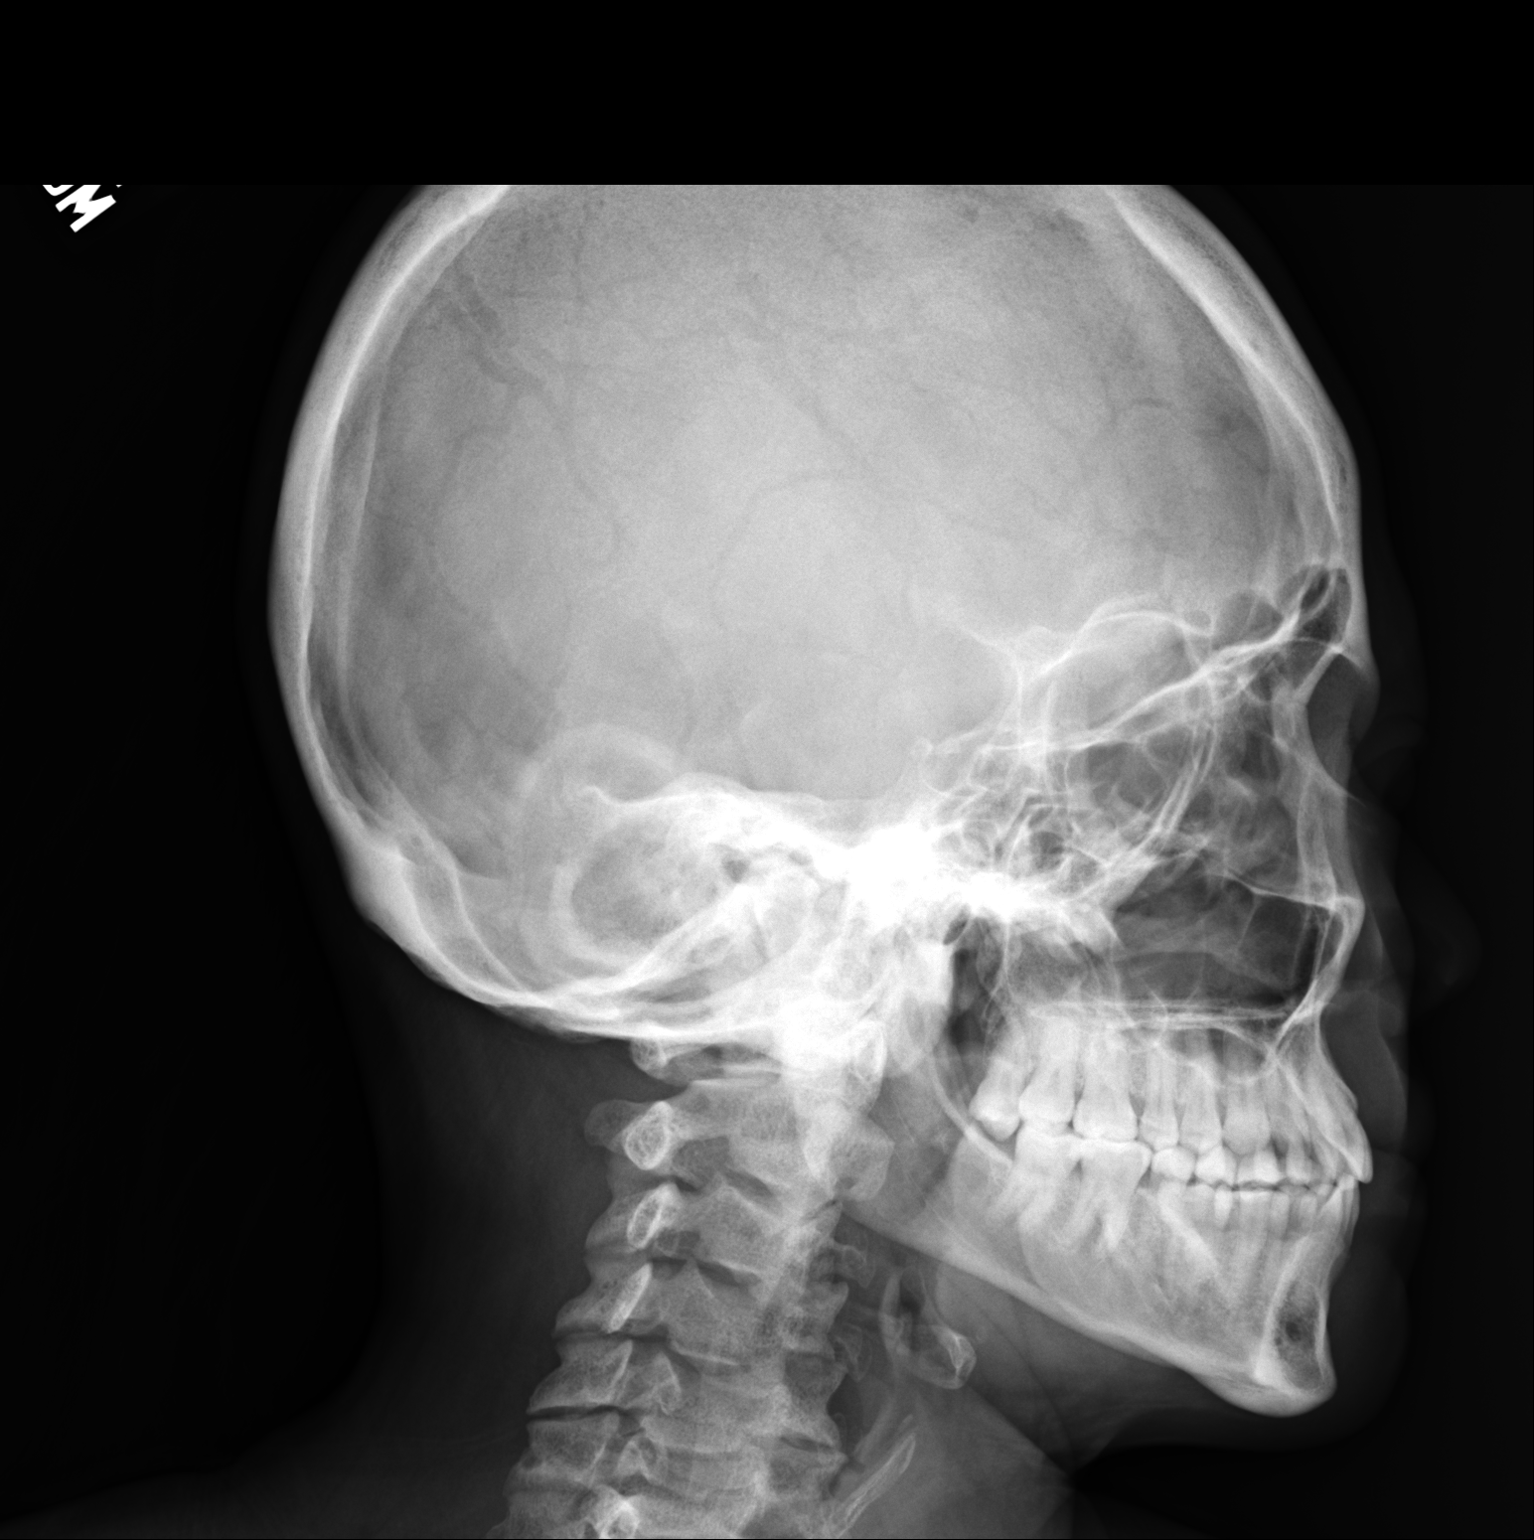

[2 of 2 positions shown; findings below may reference images not displayed]

FINDINGS: No obvious radiopaque foreign body or metallic foreign body seen. Paranasal sinuses are clear.
IMPRESSION: No obvious ear implants seen.

No charge noncontrast CT temporal bone exam is advised for possible implant evaluation.

## 2021-09-21 IMAGING — CR ARTHROGRAM SHOULDER LT
4 series · 4 of 4 positions shown · non-contrast
Comparison: None available.

INDICATION: Complete rotator cuff tear or rupture of unspecified shoulder, not specified as traumatic

[AP]
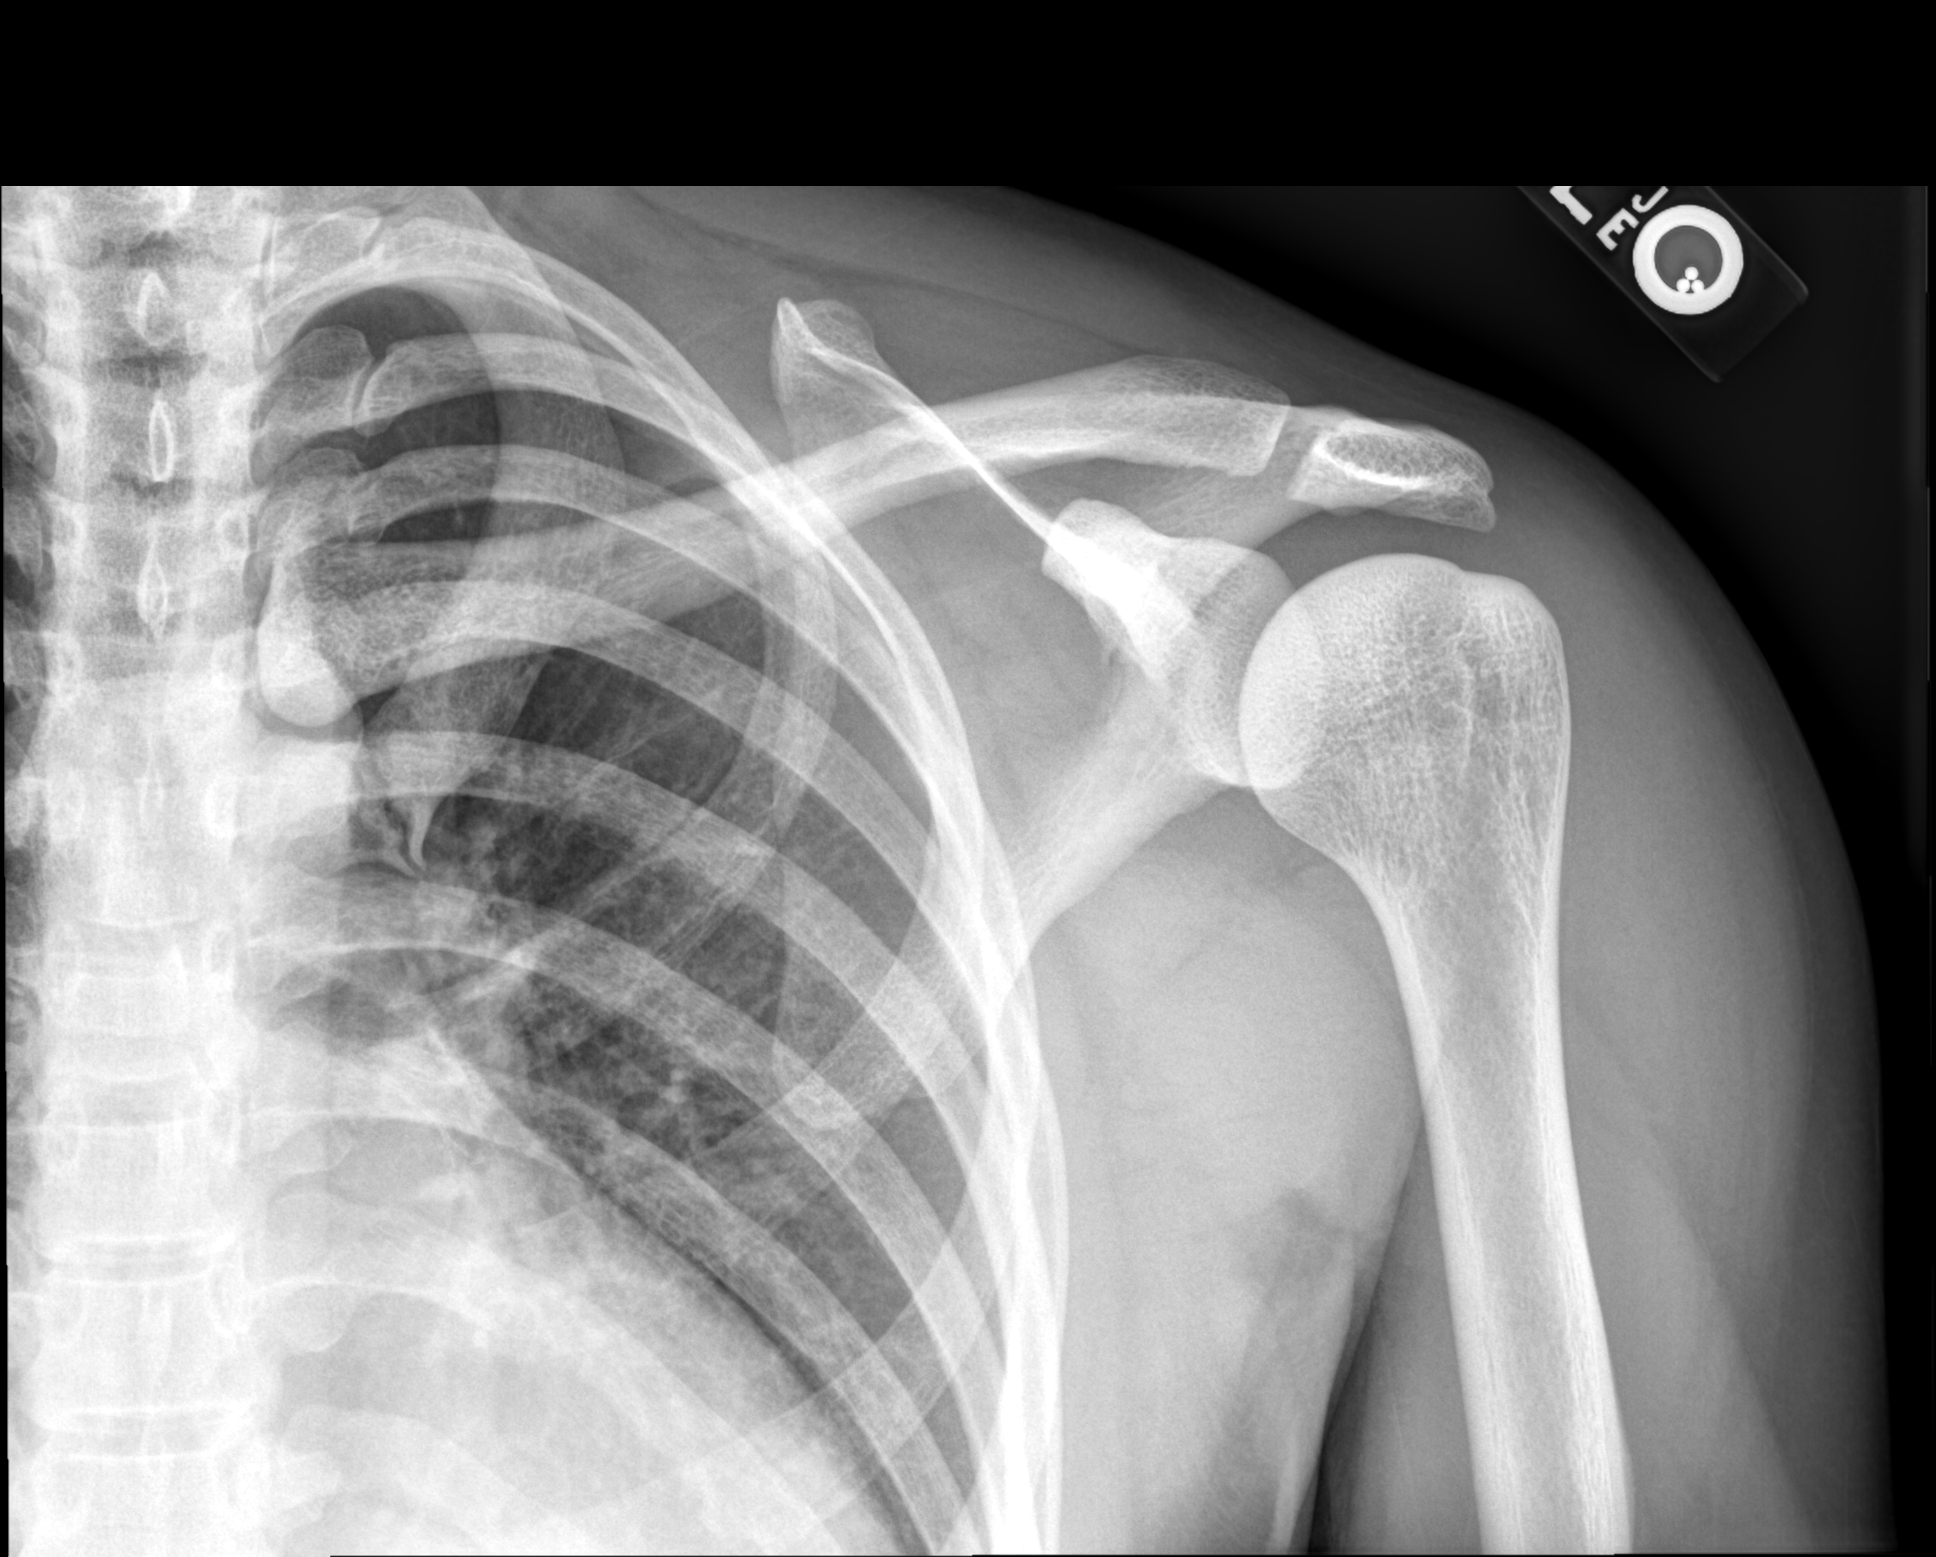

[cp_standard]
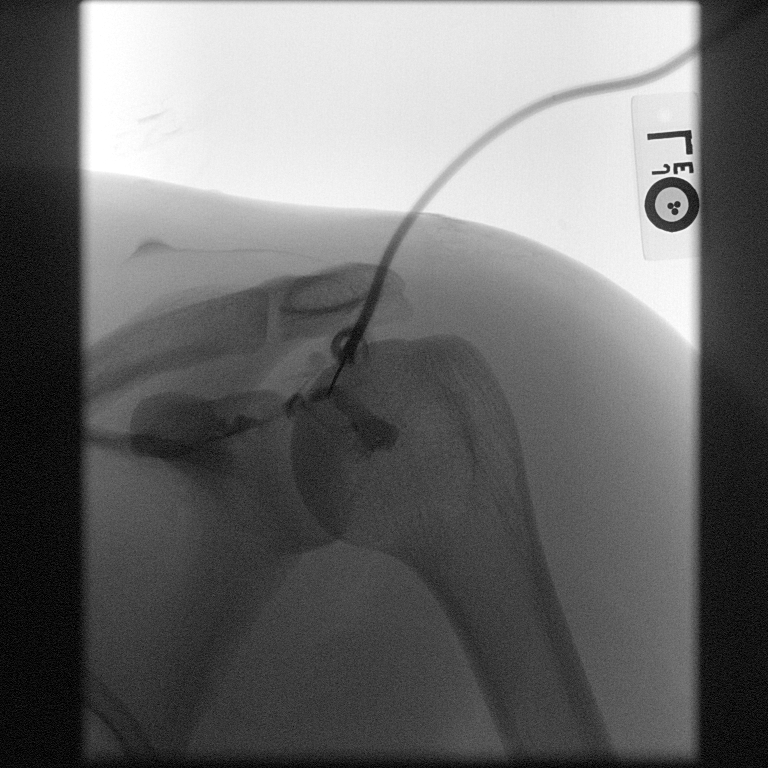

[fluoro_arthrogram_singleshot_bw (1 of 2)]
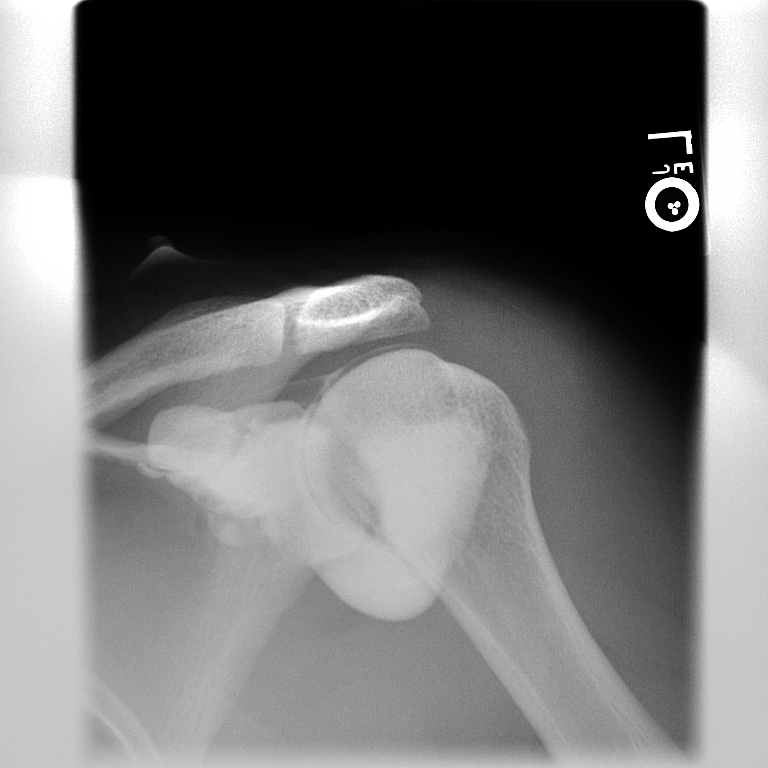

[fluoro_arthrogram_singleshot_bw (2 of 2)]
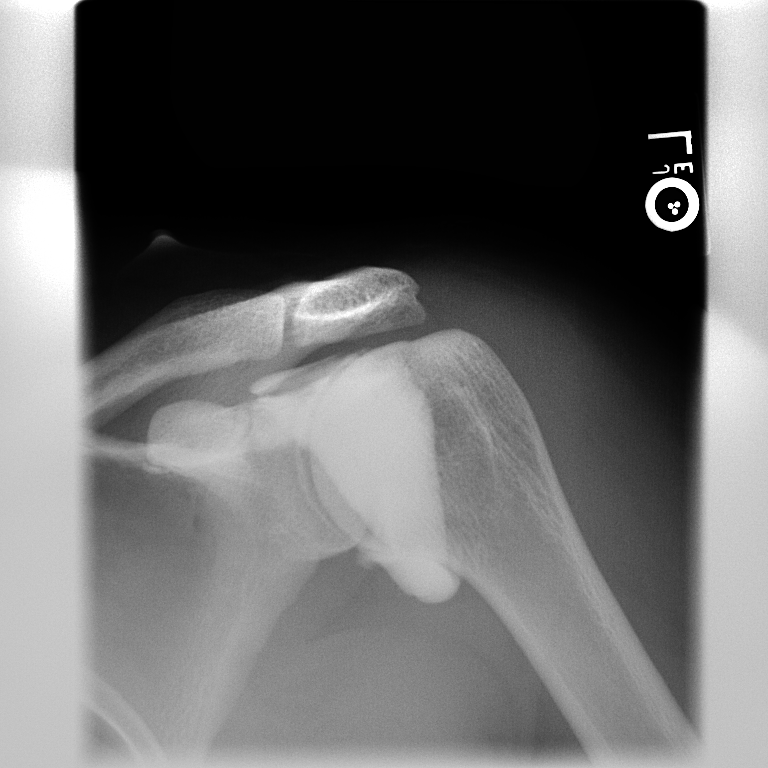

[4 of 4 positions shown; findings below may reference images not displayed]

PROCEDURE:

After discussion of the risks and benefits of the procedure, including bleeding and infection, informed consent was obtained. The patient placed supine on the fluoroscopy table. Timeout was performed. Skin entry site over the anterior left shoulder was then marked fluoroscopic guidance. The patient was then prepped and draped in the usual sterile fashion. Local anesthesia was achieved using a small amount of 1% lidocaine. A 22-gauge hypodermic needle was then advanced into the left shoulder under fluoroscopic guidance. A small amount of radiographic contrast was injected which confirmed needle positioning. Then 12 mL of solution containing 10 mL of normal saline, 5 mL of lidocaine, 5 mL of Omnipaque 240 and 0.1 mL of ProHance was injected intra-articularly. The needle was removed. The patient tolerated the procedure well without immediate complication.

RADIATION DOSE:

Fluoroscopic images: 4

Fluoroscopy time: 1.1 minutes

Dose area product: 113.68 microGy*m2

Reference air kerma: 8.8 mGy

Initial scout radiographic images demonstrate no acute fracture or dislocation. The acromioclavicular and glenohumeral joint spaces are unremarkable. The visualized portion left chest is unremarkable. No contrast extravasation is identified into the subacromial/subdeltoid bursa on the conventional arthrographic images to suggest full-thickness rotator cuff tear.
IMPRESSION: Successful left shoulder injection for MR arthrogram.

## 2021-09-21 IMAGING — MR MRI SHOULDER LT W CONTRAST
7 of 8 series · 35 of 40 positions shown · non-contrast
Comparison: MRI of the left shoulder, 08/14/2021.

INDICATION: Complete rotator cuff tear or rupture of left shoulder, not specified as traumatic.
TECHNIQUE: Multiplanar, multisequence MR imaging of the left shoulder after the intra-articular administration of contrast.

[Series 5: t2_axial_fs · axial · left · 3.0mm · 0.50mm/px · z∈[-43,+48]mm · 6 of 24 slices shown]
[im 1/24]
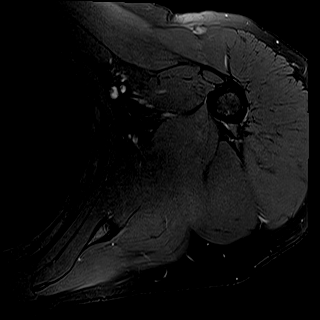
[im 5/24]
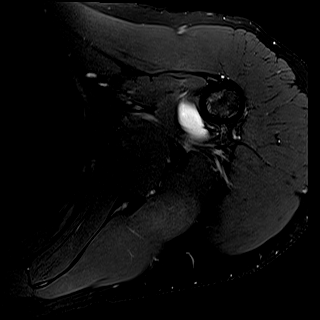
[im 10/24]
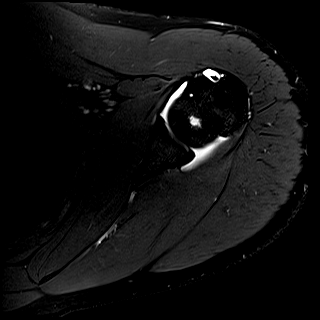
[im 14/24]
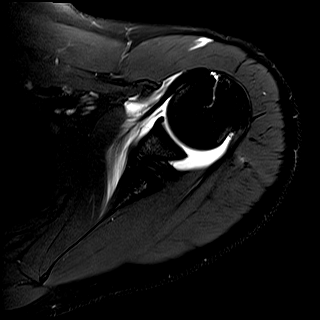
[im 19/24]
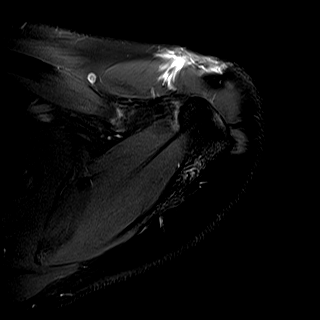
[im 24/24]
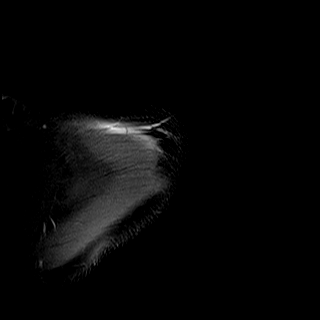

[Series 6: t1_axial_fs · axial · left · 3.0mm · 0.50mm/px · z∈[-43,+48]mm · 5 of 24 slices shown]
[im 1/24]
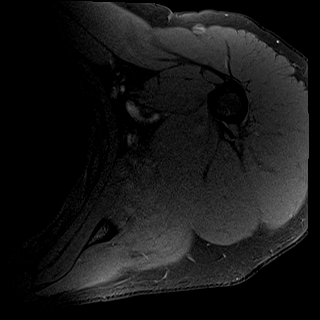
[im 6/24]
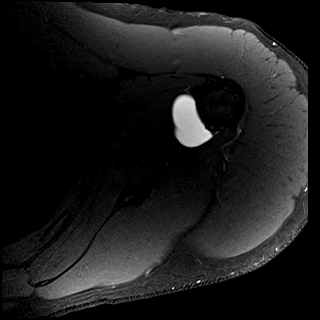
[im 12/24]
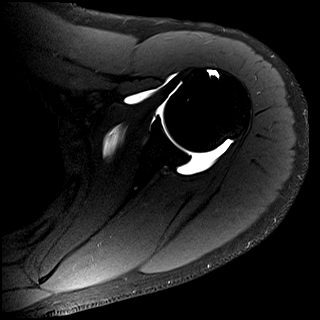
[im 18/24]
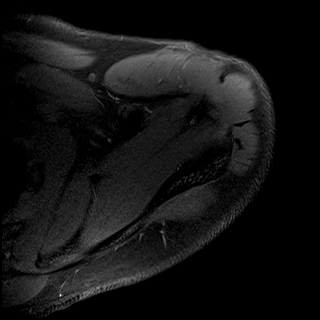
[im 24/24]
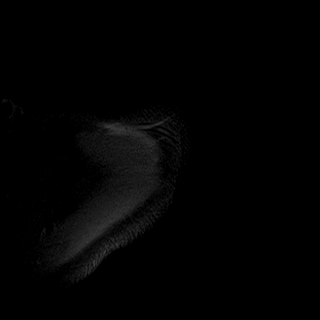

[Series 7: t2_cor_fs · oblique · left · 3.0mm · 0.44mm/px · 4 of 21 slices shown]
[im 1/21]
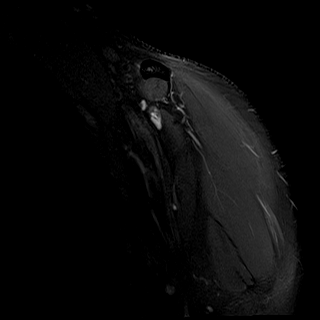
[im 7/21]
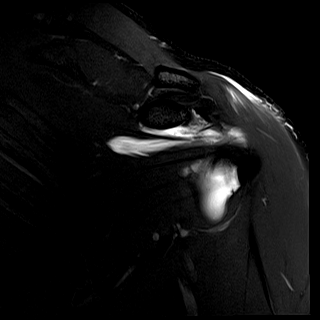
[im 14/21]
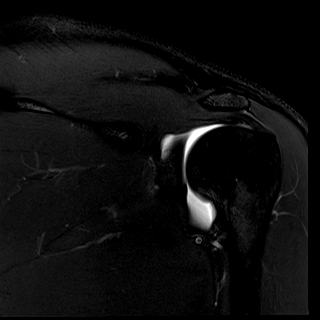
[im 21/21]
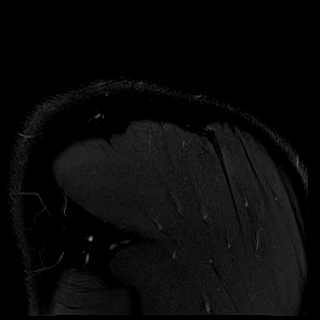

[Series 8: t1_cor_fs · oblique · left · 3.0mm · 0.44mm/px · 4 of 21 slices shown]
[im 1/21]
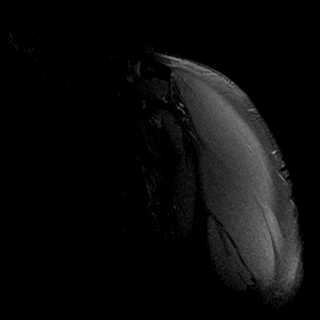
[im 7/21]
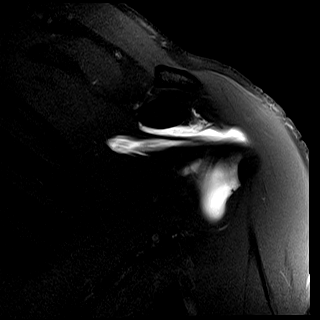
[im 14/21]
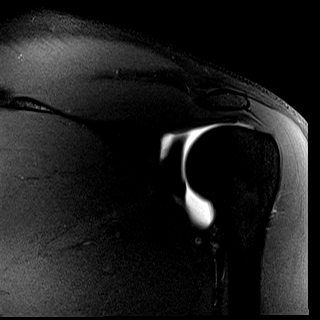
[im 21/21]
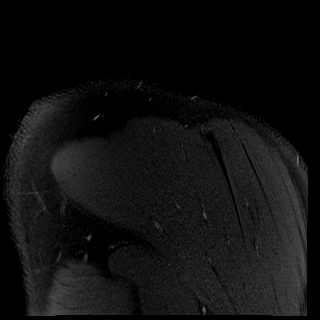

[Series 9: t2_sag_fs · oblique · left · 3.0mm · 0.43mm/px · 5 of 24 slices shown]
[im 1/24]
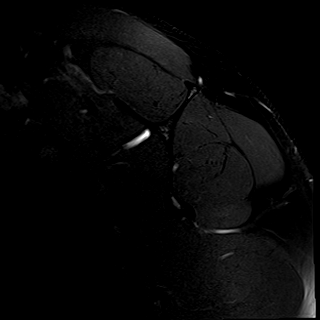
[im 6/24]
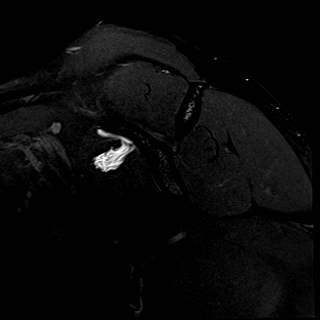
[im 12/24]
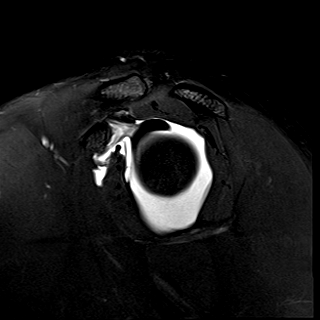
[im 18/24]
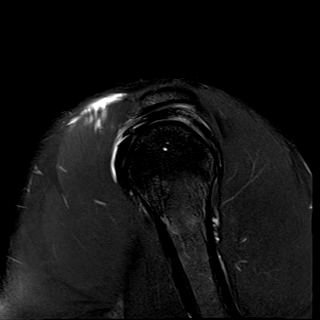
[im 24/24]
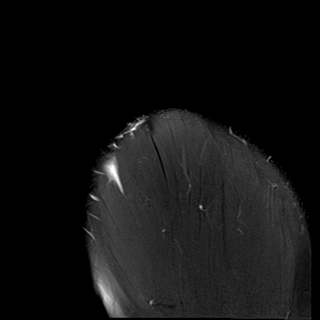

[Series 10: t1_sag · oblique · left · 3.0mm · 0.36mm/px · 5 of 24 slices shown]
[im 1/24]
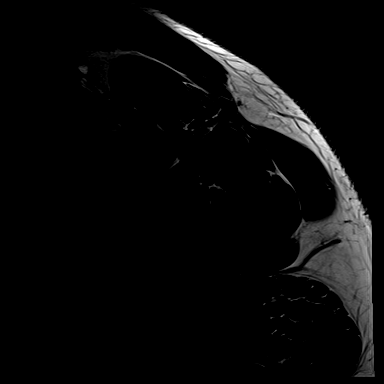
[im 6/24]
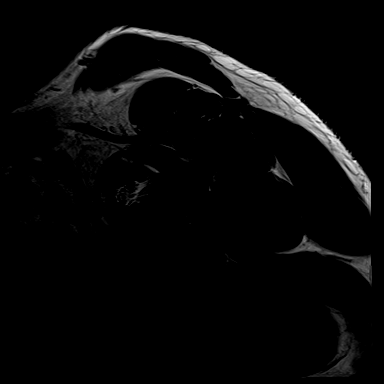
[im 12/24]
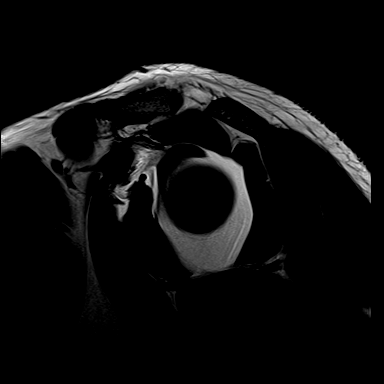
[im 18/24]
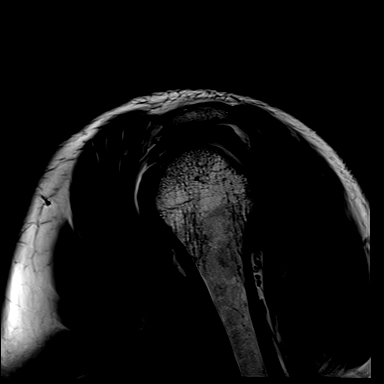
[im 24/24]
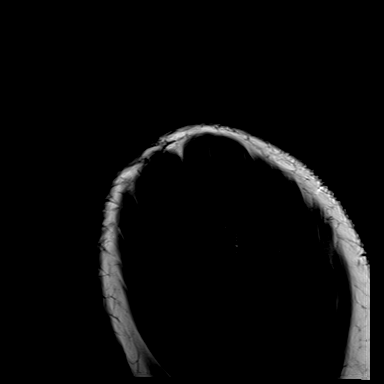

[Series 13: t1_fs_aber · sagittal · left · 3.0mm · 0.55mm/px · 6 of 27 slices shown]
[im 1/27]
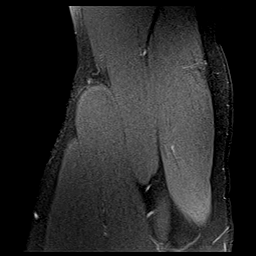
[im 6/27]
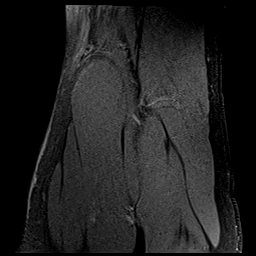
[im 11/27]
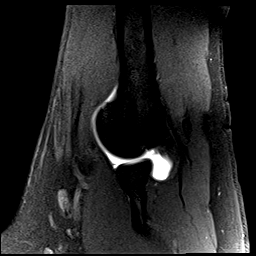
[im 16/27]
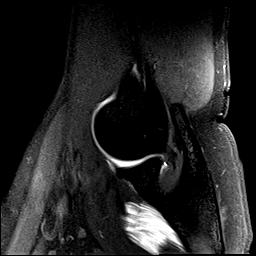
[im 21/27]
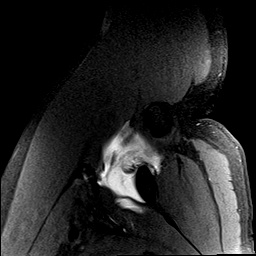
[im 27/27]
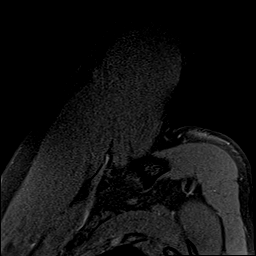

[35 of 40 positions shown; findings below may reference images not displayed]

FINDINGS: OSSEOUS: No acute fracture or avascular necrosis. A 7 x 7 mm ovoid T1 hypointense, T2 hyperintense structure present within the humeral head, unchanged when compared to 08/14/2021. No radiographic correlate on concurrent arthrogram. Likely representing nonaggressive fibro-osseous lesion or intraosseous cystic change. Flattening of the posterior lateral aspect of the humeral head (series 5, image 10), raising question of prior anterior shoulder dislocation. No osseous Zubaite.

ACROMIAL OUTLET: 

Acromioclavicular joint osteoarthritis: No significant osteoarthritis.

Acromion: Non-hooked.

Subacromial/subdeltoid bursa fluid: No significant fluid.

Coracoclavicular and coracoacromial ligaments: Intact.

ROTATOR CUFF:

Supraspinatus and infraspinatus: Intact

Teres minor: Intact.

Subscapularis: Intact.

Rotator cuff muscles: No fatty atrophy or intramuscular edema.

LABRUM: Intact

BICEPS TENDON: The proximal intra-articular and extra-articular long head biceps tendon are intact.

GLENOHUMERAL JOINT: The glenohumeral articular cartilage is maintained. No focal chondral defect is identified. The glenohumeral joint is normal in alignment.

OTHER: The inferior glenohumeral ligament is unremarkable. The glenohumeral joint is distended with intra-articular contrast
IMPRESSION: 1.
Intact rotator cuff and labrum.

2.
Flattening of the posterior lateral aspect of the humeral head, raising question of remote anterior shoulder dislocation. No corresponding tear of the anteroinferior labrum or osseous Zubaite.
